# Patient Record
Sex: Female | Born: 1950 | Race: Black or African American | Hispanic: No | Smoking: Current every day smoker
Health system: Southern US, Community
[De-identification: ages and names within clinical notes are randomized; demographics above are authoritative.]

## PROBLEM LIST (undated history)

## (undated) DIAGNOSIS — I10 Essential (primary) hypertension: Secondary | ICD-10-CM

## (undated) DIAGNOSIS — E7800 Pure hypercholesterolemia, unspecified: Secondary | ICD-10-CM

## (undated) DIAGNOSIS — E119 Type 2 diabetes mellitus without complications: Secondary | ICD-10-CM

## (undated) HISTORY — PX: ABDOMINAL HYSTERECTOMY: SHX81

## (undated) HISTORY — PX: EXPLORATORY LAPAROTOMY: SUR591

## (undated) HISTORY — PX: OTHER SURGICAL HISTORY: SHX169

---

## 2003-09-22 ENCOUNTER — Inpatient Hospital Stay (HOSPITAL_COMMUNITY): Admission: AD | Admit: 2003-09-22 | Discharge: 2003-09-27 | Payer: Self-pay | Admitting: Internal Medicine

## 2003-09-22 ENCOUNTER — Encounter (INDEPENDENT_AMBULATORY_CARE_PROVIDER_SITE_OTHER): Payer: Self-pay | Admitting: Internal Medicine

## 2004-02-22 ENCOUNTER — Encounter (INDEPENDENT_AMBULATORY_CARE_PROVIDER_SITE_OTHER): Payer: Self-pay | Admitting: Internal Medicine

## 2004-02-22 LAB — CONVERTED CEMR LAB: Hgb A1c MFr Bld: 6.8 %

## 2004-07-05 ENCOUNTER — Ambulatory Visit (HOSPITAL_COMMUNITY): Admission: RE | Admit: 2004-07-05 | Discharge: 2004-07-05 | Payer: Self-pay | Admitting: General Surgery

## 2004-07-12 ENCOUNTER — Encounter (HOSPITAL_COMMUNITY): Admission: RE | Admit: 2004-07-12 | Discharge: 2004-07-13 | Payer: Self-pay | Admitting: Internal Medicine

## 2004-07-13 ENCOUNTER — Encounter (INDEPENDENT_AMBULATORY_CARE_PROVIDER_SITE_OTHER): Payer: Self-pay | Admitting: Internal Medicine

## 2005-01-28 ENCOUNTER — Ambulatory Visit: Payer: Self-pay | Admitting: Orthopedic Surgery

## 2005-05-21 ENCOUNTER — Encounter (INDEPENDENT_AMBULATORY_CARE_PROVIDER_SITE_OTHER): Payer: Self-pay | Admitting: Internal Medicine

## 2005-05-31 ENCOUNTER — Encounter (INDEPENDENT_AMBULATORY_CARE_PROVIDER_SITE_OTHER): Payer: Self-pay | Admitting: Internal Medicine

## 2005-10-08 ENCOUNTER — Ambulatory Visit: Payer: Self-pay | Admitting: Internal Medicine

## 2005-10-21 ENCOUNTER — Encounter (INDEPENDENT_AMBULATORY_CARE_PROVIDER_SITE_OTHER): Payer: Self-pay | Admitting: Internal Medicine

## 2005-10-21 LAB — CONVERTED CEMR LAB: Hgb A1c MFr Bld: 6.2 %

## 2005-10-28 ENCOUNTER — Encounter (INDEPENDENT_AMBULATORY_CARE_PROVIDER_SITE_OTHER): Payer: Self-pay | Admitting: Internal Medicine

## 2005-11-07 ENCOUNTER — Ambulatory Visit: Payer: Self-pay | Admitting: Internal Medicine

## 2005-11-12 ENCOUNTER — Encounter (INDEPENDENT_AMBULATORY_CARE_PROVIDER_SITE_OTHER): Payer: Self-pay | Admitting: Internal Medicine

## 2005-12-04 ENCOUNTER — Encounter (INDEPENDENT_AMBULATORY_CARE_PROVIDER_SITE_OTHER): Payer: Self-pay | Admitting: Internal Medicine

## 2006-01-02 ENCOUNTER — Ambulatory Visit: Payer: Self-pay | Admitting: Internal Medicine

## 2006-01-07 ENCOUNTER — Encounter: Payer: Self-pay | Admitting: Internal Medicine

## 2006-01-07 DIAGNOSIS — M1990 Unspecified osteoarthritis, unspecified site: Secondary | ICD-10-CM | POA: Insufficient documentation

## 2006-01-07 DIAGNOSIS — K219 Gastro-esophageal reflux disease without esophagitis: Secondary | ICD-10-CM | POA: Insufficient documentation

## 2006-01-07 DIAGNOSIS — J309 Allergic rhinitis, unspecified: Secondary | ICD-10-CM | POA: Insufficient documentation

## 2006-01-07 DIAGNOSIS — I10 Essential (primary) hypertension: Secondary | ICD-10-CM

## 2006-01-07 DIAGNOSIS — E782 Mixed hyperlipidemia: Secondary | ICD-10-CM

## 2006-02-13 ENCOUNTER — Ambulatory Visit: Payer: Self-pay | Admitting: Internal Medicine

## 2006-05-15 ENCOUNTER — Encounter: Payer: Self-pay | Admitting: Internal Medicine

## 2006-05-21 ENCOUNTER — Ambulatory Visit: Payer: Self-pay | Admitting: Internal Medicine

## 2006-05-21 DIAGNOSIS — F17200 Nicotine dependence, unspecified, uncomplicated: Secondary | ICD-10-CM

## 2006-05-21 LAB — CONVERTED CEMR LAB: Hgb A1c MFr Bld: 6 %

## 2006-05-22 ENCOUNTER — Ambulatory Visit (HOSPITAL_COMMUNITY): Admission: RE | Admit: 2006-05-22 | Discharge: 2006-05-22 | Payer: Self-pay | Admitting: Internal Medicine

## 2006-05-29 ENCOUNTER — Ambulatory Visit: Payer: Self-pay | Admitting: Internal Medicine

## 2006-06-19 ENCOUNTER — Encounter (INDEPENDENT_AMBULATORY_CARE_PROVIDER_SITE_OTHER): Payer: Self-pay | Admitting: Internal Medicine

## 2006-09-03 ENCOUNTER — Ambulatory Visit: Payer: Self-pay | Admitting: Internal Medicine

## 2006-09-03 LAB — CONVERTED CEMR LAB
Blood Glucose, Fingerstick: 93
Hgb A1c MFr Bld: 6.3 %

## 2006-09-04 ENCOUNTER — Encounter (INDEPENDENT_AMBULATORY_CARE_PROVIDER_SITE_OTHER): Payer: Self-pay | Admitting: Internal Medicine

## 2006-09-04 LAB — CONVERTED CEMR LAB
AST: 15 units/L (ref 0–37)
Alkaline Phosphatase: 65 units/L (ref 39–117)
BUN: 14 mg/dL (ref 6–23)
Chloride: 109 meq/L (ref 96–112)
Creatinine, Ser: 0.95 mg/dL (ref 0.40–1.20)
Glucose, Bld: 99 mg/dL (ref 70–99)
Potassium: 4.6 meq/L (ref 3.5–5.3)
Sodium: 142 meq/L (ref 135–145)
Total Protein: 7 g/dL (ref 6.0–8.3)

## 2006-10-08 ENCOUNTER — Encounter (INDEPENDENT_AMBULATORY_CARE_PROVIDER_SITE_OTHER): Payer: Self-pay | Admitting: Internal Medicine

## 2006-10-09 ENCOUNTER — Telehealth (INDEPENDENT_AMBULATORY_CARE_PROVIDER_SITE_OTHER): Payer: Self-pay | Admitting: *Deleted

## 2006-10-14 ENCOUNTER — Encounter (INDEPENDENT_AMBULATORY_CARE_PROVIDER_SITE_OTHER): Payer: Self-pay | Admitting: Internal Medicine

## 2006-10-14 ENCOUNTER — Ambulatory Visit (HOSPITAL_COMMUNITY): Admission: RE | Admit: 2006-10-14 | Discharge: 2006-10-14 | Payer: Self-pay | Admitting: Internal Medicine

## 2006-11-07 ENCOUNTER — Encounter (INDEPENDENT_AMBULATORY_CARE_PROVIDER_SITE_OTHER): Payer: Self-pay | Admitting: Internal Medicine

## 2006-11-11 ENCOUNTER — Telehealth (INDEPENDENT_AMBULATORY_CARE_PROVIDER_SITE_OTHER): Payer: Self-pay | Admitting: *Deleted

## 2006-11-14 ENCOUNTER — Ambulatory Visit: Payer: Self-pay | Admitting: Internal Medicine

## 2006-11-14 DIAGNOSIS — G5600 Carpal tunnel syndrome, unspecified upper limb: Secondary | ICD-10-CM | POA: Insufficient documentation

## 2006-11-14 DIAGNOSIS — E1149 Type 2 diabetes mellitus with other diabetic neurological complication: Secondary | ICD-10-CM

## 2007-01-01 ENCOUNTER — Ambulatory Visit: Payer: Self-pay | Admitting: Internal Medicine

## 2007-01-01 LAB — CONVERTED CEMR LAB: Hgb A1c MFr Bld: 6.6 %

## 2007-01-02 ENCOUNTER — Encounter (INDEPENDENT_AMBULATORY_CARE_PROVIDER_SITE_OTHER): Payer: Self-pay | Admitting: Internal Medicine

## 2007-01-02 LAB — CONVERTED CEMR LAB
Creatinine, Urine: 127.2 mg/dL
Microalb Creat Ratio: 13.3 mg/g (ref 0.0–30.0)
Microalb, Ur: 1.69 mg/dL (ref 0.00–1.89)

## 2007-04-03 ENCOUNTER — Ambulatory Visit: Payer: Self-pay | Admitting: Internal Medicine

## 2007-04-06 ENCOUNTER — Telehealth (INDEPENDENT_AMBULATORY_CARE_PROVIDER_SITE_OTHER): Payer: Self-pay | Admitting: *Deleted

## 2007-04-06 LAB — CONVERTED CEMR LAB
ALT: 14 units/L (ref 0–35)
AST: 17 units/L (ref 0–37)
Albumin: 3.8 g/dL (ref 3.5–5.2)
CO2: 18 meq/L — ABNORMAL LOW (ref 19–32)
Glucose, Bld: 109 mg/dL — ABNORMAL HIGH (ref 70–99)
HDL: 43 mg/dL (ref >39)
Potassium: 4.4 meq/L (ref 3.5–5.3)
Total CHOL/HDL Ratio: 3.4
VLDL: 26 mg/dL (ref 0–40)

## 2007-07-03 ENCOUNTER — Ambulatory Visit: Payer: Self-pay | Admitting: Internal Medicine

## 2007-07-03 LAB — CONVERTED CEMR LAB
Blood Glucose, Fingerstick: 97
Hgb A1c MFr Bld: 6.6 %

## 2007-10-23 ENCOUNTER — Ambulatory Visit: Payer: Self-pay | Admitting: Internal Medicine

## 2007-10-23 LAB — CONVERTED CEMR LAB: Blood Glucose, Fingerstick: 166

## 2007-10-26 LAB — CONVERTED CEMR LAB
AST: 17 units/L (ref 0–37)
Albumin: 3.9 g/dL (ref 3.5–5.2)
Alkaline Phosphatase: 67 units/L (ref 39–117)
Calcium: 8.9 mg/dL (ref 8.4–10.5)
Cholesterol: 141 mg/dL (ref 0–200)
Creatinine, Ser: 0.92 mg/dL (ref 0.40–1.20)
Potassium: 4.3 meq/L (ref 3.5–5.3)
Total Bilirubin: 0.3 mg/dL (ref 0.3–1.2)
Total CHOL/HDL Ratio: 3.7
Triglycerides: 256 mg/dL — ABNORMAL HIGH (ref <150)

## 2007-12-04 ENCOUNTER — Ambulatory Visit: Payer: Self-pay | Admitting: Internal Medicine

## 2008-01-01 ENCOUNTER — Ambulatory Visit: Payer: Self-pay | Admitting: Internal Medicine

## 2008-01-29 ENCOUNTER — Ambulatory Visit: Payer: Self-pay | Admitting: Internal Medicine

## 2008-01-29 LAB — CONVERTED CEMR LAB
Blood Glucose, Fingerstick: 97
Hgb A1c MFr Bld: 7 %

## 2008-05-06 ENCOUNTER — Ambulatory Visit: Payer: Self-pay | Admitting: Internal Medicine

## 2008-05-06 LAB — CONVERTED CEMR LAB: Blood Glucose, Fingerstick: 97

## 2008-05-13 ENCOUNTER — Encounter (INDEPENDENT_AMBULATORY_CARE_PROVIDER_SITE_OTHER): Payer: Self-pay | Admitting: Internal Medicine

## 2008-05-16 LAB — CONVERTED CEMR LAB
BUN: 14 mg/dL (ref 6–23)
Cholesterol: 140 mg/dL (ref 0–200)
Glucose, Bld: 109 mg/dL — ABNORMAL HIGH (ref 70–99)
HDL: 44 mg/dL (ref >39)
LDL Cholesterol: 67 mg/dL (ref 0–99)
Total Protein: 7.5 g/dL (ref 6.0–8.3)
Triglycerides: 145 mg/dL (ref <150)

## 2008-08-03 ENCOUNTER — Ambulatory Visit: Payer: Self-pay | Admitting: Internal Medicine

## 2008-08-03 DIAGNOSIS — M25519 Pain in unspecified shoulder: Secondary | ICD-10-CM

## 2008-08-03 LAB — CONVERTED CEMR LAB: Blood Glucose, Fingerstick: 141

## 2009-08-07 ENCOUNTER — Ambulatory Visit (HOSPITAL_COMMUNITY): Admission: RE | Admit: 2009-08-07 | Discharge: 2009-08-07 | Payer: Self-pay | Admitting: Family Medicine

## 2009-08-08 ENCOUNTER — Emergency Department (HOSPITAL_COMMUNITY): Admission: EM | Admit: 2009-08-08 | Discharge: 2009-08-09 | Payer: Self-pay | Admitting: Emergency Medicine

## 2009-08-31 ENCOUNTER — Encounter (HOSPITAL_BASED_OUTPATIENT_CLINIC_OR_DEPARTMENT_OTHER): Admission: RE | Admit: 2009-08-31 | Discharge: 2009-10-23 | Payer: Self-pay | Admitting: General Surgery

## 2009-09-01 ENCOUNTER — Ambulatory Visit (HOSPITAL_COMMUNITY): Admission: RE | Admit: 2009-09-01 | Discharge: 2009-09-01 | Payer: Self-pay | Admitting: General Surgery

## 2009-10-05 ENCOUNTER — Ambulatory Visit: Payer: Self-pay | Admitting: Surgery

## 2009-10-19 ENCOUNTER — Ambulatory Visit (HOSPITAL_COMMUNITY): Admission: RE | Admit: 2009-10-19 | Discharge: 2009-10-19 | Payer: Self-pay | Admitting: Neurology

## 2010-02-18 LAB — CONVERTED CEMR LAB
CO2: 20 meq/L
Calcium: 9.2 mg/dL
Chloride: 111 meq/L
Cholesterol: 140 mg/dL
HDL: 43 mg/dL
Hgb A1c MFr Bld: 5.7 %
Microalb Creat Ratio: 2.2 mg/g
Total Bilirubin: 0.3 mg/dL

## 2010-04-06 LAB — CBC
Hemoglobin: 13.3 g/dL (ref 12.0–15.0)
MCH: 31 pg (ref 26.0–34.0)
MCHC: 34.1 g/dL (ref 30.0–36.0)
MCV: 90.9 fL (ref 78.0–100.0)
RDW: 13.2 % (ref 11.5–15.5)

## 2010-04-06 LAB — HEMOGLOBIN A1C: Mean Plasma Glucose: 235 mg/dL — ABNORMAL HIGH (ref <117)

## 2010-04-06 LAB — COMPREHENSIVE METABOLIC PANEL
AST: 20 U/L (ref 0–37)
Albumin: 3.7 g/dL (ref 3.5–5.2)
Alkaline Phosphatase: 65 U/L (ref 39–117)
BUN: 12 mg/dL (ref 6–23)
Creatinine, Ser: 0.91 mg/dL (ref 0.4–1.2)
GFR calc Af Amer: >60 mL/min (ref >60)
Potassium: 4.6 mEq/L (ref 3.5–5.1)
Total Protein: 7.8 g/dL (ref 6.0–8.3)

## 2010-04-06 LAB — PREALBUMIN: Prealbumin: 22.1 mg/dL (ref 18.0–45.0)

## 2010-04-06 LAB — DIFFERENTIAL
Lymphocytes Relative: 31 % (ref 12–46)
Monocytes Absolute: 0.6 10*3/uL (ref 0.1–1.0)
Monocytes Relative: 10 % (ref 3–12)
Neutro Abs: 2.9 10*3/uL (ref 1.7–7.7)

## 2010-04-07 LAB — CBC
HCT: 36.9 % (ref 36.0–46.0)
Hemoglobin: 12.8 g/dL (ref 12.0–15.0)
RBC: 4.17 MIL/uL (ref 3.87–5.11)
RDW: 13.5 % (ref 11.5–15.5)
WBC: 6.2 10*3/uL (ref 4.0–10.5)

## 2010-04-07 LAB — DIFFERENTIAL
Basophils Absolute: 0.1 10*3/uL (ref 0.0–0.1)
Lymphocytes Relative: 34 % (ref 12–46)
Monocytes Absolute: 0.4 10*3/uL (ref 0.1–1.0)
Monocytes Relative: 7 % (ref 3–12)
Neutro Abs: 3.4 10*3/uL (ref 1.7–7.7)

## 2010-04-07 LAB — BASIC METABOLIC PANEL
GFR calc Af Amer: 49 mL/min — ABNORMAL LOW (ref >60)
GFR calc non Af Amer: 41 mL/min — ABNORMAL LOW (ref >60)
Potassium: 4.3 mEq/L (ref 3.5–5.1)
Sodium: 136 mEq/L (ref 135–145)

## 2010-04-07 LAB — CULTURE, ROUTINE-ABSCESS: Culture: NO GROWTH

## 2010-06-05 NOTE — Procedures (Signed)
DUPLEX DEEP VENOUS EXAM - LOWER EXTREMITY   INDICATION:  Ulcer.   HISTORY:  Edema:  Complaint of right leg swelling for 2 months.  Trauma/Surgery:  No.  Pain:  Right lateral calf pain for 2 months.  PE:  No.  Previous DVT:  No.  Anticoagulants:  No.  Other:  The patient states she has a healed right toe infection/wound  from a month ago that lasted for 2 months.   DUPLEX EXAM:                CFV   SFV   PopV  PTV    GSV                R  L  R  L  R  L  R   L  R  L  Thrombosis    o  o  o  o  o  o  o   o  o  o  Spontaneous   +  +  +  +  +  +  +   +  +  +  Phasic        +  +  +  +  +  +  +   +  +  +  Augmentation  +  +  +  +  +  +  +   +  +  +  Compressible  +  +  +  +  +  +  +   +  +  +  Competent     +  o  +  +  +  +  +   +  o  +   Legend:  + - yes  o - no  p - partial  D - decreased   IMPRESSION:  1. No evidence of deep vein thrombosis noted in the bilateral lower      extremities.  2. Mild reflux is noted in the right common femoral and left greater      saphenous veins.    _____________________________  V. Charlena Cross, MD   CH/MEDQ  D:  10/06/2009  T:  10/06/2009  Job:  [id number]

## 2010-06-08 NOTE — Discharge Summary (Signed)
NAME:  Sandra Mitchell, Sandra Mitchell                        ACCOUNT NO.:  [id number]   MEDICAL RECORD NO.:  [id number]                   PATIENT TYPE:  INP   LOCATION:  A305                                 FACILITY:  [facility]   PHYSICIAN:  Vania Rea, M.D.              DATE OF BIRTH:  1950-08-15   DATE OF ADMISSION:  09/22/2003  DATE OF DISCHARGE:  09/27/2003                                 DISCHARGE SUMMARY   PRIMARY CARE PHYSICIAN:  Dr. Jorene Guest in [place].   SURGICAL CONSULTATION:  Dirk Dress. Katrinka Blazing, M.D.   DISCHARGE DIAGNOSES:  1.  Cellulitis of the left lower extremity, resolved.  2.  Left lower extremity web space infection, improved.  3.  Diabetes mellitus type 2, controlled.  4.  Hyperlipidemia.  5.  Tobacco abuse.   DISPOSITION:  Discharge to home.   CONDITION ON DISCHARGE:  Stable.   DISCHARGE MEDICATIONS:  1.  Doxycycline 100 mg twice daily for one week.  2.  Levaquin 750 mg p.o. daily for three days.  3.  Glucophage X-R 500 mg twice daily.  4.  Lipitor 20 mg each evening.  5.  Nizoral cream apply in the first web space of left foot twice daily.   HOSPITAL COURSE:  This pleasant 60 year old African American lady was  admitted directed to [facility] after a phone call from Dr. Reynolds Bowl in [place].  Dr. Jorene Guest indicated that he had been  treating this lady for sometime with Rocephin and Keflex for cellulitis of  the left foot and it had been resolving very slowly.  The patient was  admitted directly by Dr. Delbert Harness. By her history the cellulitis of the leg  had improved considerably, but she continued to have swelling and cellulitis  of the left foot and she had necrotic areas in the web space of the left  foot which looked as if there was a fungal component.  The patient was  started on IV antibiotics and Nizoral cream to the web space of the foot.  A  surgical consult was done by Dr. Katrinka Blazing who felt that there may be some  element of peripheral  vascular disease also and an ABI  was ordered which  has been done today.   The patient's cellulitis has improved progressively on intravenous Levaquin  and doxycycline was also added by Dr. Katrinka Blazing.  Her blood pressure was fairly  well controlled during this hospital stay.  Her hemoglobin A1C was 7.  Metformin has been added to her medication in view of her hyperlipidemia and  her significant weight.  Today the cellulitis of her foot has completely  cleared; however, there remains healing of the web space of the left foot.  It remains quite necrotic.  Probably will need some debridement which can be  done as an outpatient.   Today the patient remains alert and pleasant, fully cooperative.  Her  temperature is  97.6, pulse 54, respirations 20, blood pressure 133/71.  Capillary blood sugar this morning was 104.  Her bedtime blood sugar was 156  which is the highest it has ever been.  Her pupils are round, equal and  reactive.  Her chest is clear to auscultation bilaterally.  Cardiovascular  system shows a regular rhythm without murmur.  Her abdomen is obese, soft  and nontender.  Her extremities are without edema.  Otherwise as described  above.   LABORATORY DATA:  Her white count is 5.6, hematocrit 39.7, platelets 209.  She has 45 neutrophils.  Sodium 139, potassium 4.6, chloride 109, CO2 23,  glucose 106, BUN 10, creatinine 1.  Liver function tests are completely  normal.  Albumin is 3.7, calcium 8.9.  Fasting lipid panel reveals a  cholesterol of 209, triglycerides 263, HDL 30, LDL 126 and a VLDL of 53.  She has been started on Zocor 20 mg at bedtime.  She will be discharged on  Lipitor 20 mg at bedtime.   FOLLOW UP:  Follow up with her primary care physician, Dr. Reynolds Bowl.  Follow up with the surgeon, Dr. Elpidio Anis.  Dr. Katrinka Blazing to follow up on the  ABI and ulcer in the first web space.   SPECIAL INSTRUCTIONS:  The patient has been advised to check her blood sugar  regularly,  to take her medications as prescribed.  She has been advised that  she needs to see an ophthalmologist at least once a year and a podiatrist  regularly.  She has been advised that she is to dry between the toes of her  feet thoroughly after taking a shower.     ___________________________________________                                         Vania Rea, M.D.   LC/MEDQ  D:  09/27/2003  T:  09/27/2003  Job:  [id number]

## 2010-06-08 NOTE — Procedures (Signed)
[name], Sandra Mitchell              ACCOUNT NO.:  [id number]   MEDICAL RECORD NO.:  [id number]          PATIENT TYPE:  OUT   LOCATION:  RAD                           FACILITY:  [facility]   PHYSICIAN:  Richard A. Alanda Amass, M.D.DATE OF BIRTH:  1950/12/20   DATE OF PROCEDURE:  07/05/2004  DATE OF DISCHARGE:                                  ECHOCARDIOGRAM   This 60 year old woman has a history of chest pain and possible cardiac  murmur.   Echocardiogram was technically adequate for interpretation.   The aorta is normal at 2.5 cm.   The aortic valve has three leaflets with normal opening and is functionally  and anatomically appears normal. There is no aortic stenosis.   Left atrium is normal at 3.7 cm. The patient was in sinus rhythm during the  study, and there were no clots seen.   Mitral valve opens normally. There is some minimal thickening of the  anterior mitral valve leaflet, but this is not specific. There is no  prolapse seen. There is trace mitral regurgitation present.   IVS and LVPW were concentrically thickened to 1.4 and 1.3 cm respectively.  This is compatible with mild to moderate concentric ventricular hypertrophy.   Left ventricle is normal in size with LVIDD measuring 4.1 cm and LVISD  measuring 2.6 cm. There were no segmental wall motion abnormalities with  normal thickening of all visualized segments. EF was greater than 55%.   LV inflow signal appears normal with no evidence of diastolic relaxation  abnormality on this study.   There is no pericardial effusion.   There is trace to mild tricuspid regurgitation present.   Right ventricle is normal.   Two-D echocardiogram shows concentric LVH of a mild to moderate degree.  There is normal systolic function. There is no significant valvular  abnormality.       RAW/MEDQ  D:  07/05/2004  T:  07/05/2004  Job:  [id number]

## 2011-05-10 ENCOUNTER — Ambulatory Visit (HOSPITAL_COMMUNITY)
Admission: RE | Admit: 2011-05-10 | Discharge: 2011-05-10 | Disposition: A | Discharge status: Home / Self Care | Payer: BC Managed Care – PPO | Source: Ambulatory Visit | Attending: Family Medicine | Admitting: Family Medicine

## 2011-05-10 ENCOUNTER — Other Ambulatory Visit (HOSPITAL_COMMUNITY): Payer: Self-pay | Admitting: Family Medicine

## 2011-05-10 DIAGNOSIS — I10 Essential (primary) hypertension: Secondary | ICD-10-CM

## 2011-05-10 DIAGNOSIS — E119 Type 2 diabetes mellitus without complications: Secondary | ICD-10-CM | POA: Insufficient documentation

## 2011-05-10 DIAGNOSIS — E785 Hyperlipidemia, unspecified: Secondary | ICD-10-CM | POA: Insufficient documentation

## 2011-05-10 DIAGNOSIS — R079 Chest pain, unspecified: Secondary | ICD-10-CM | POA: Insufficient documentation

## 2011-05-10 DIAGNOSIS — R059 Cough, unspecified: Secondary | ICD-10-CM | POA: Insufficient documentation

## 2011-05-10 DIAGNOSIS — J9819 Other pulmonary collapse: Secondary | ICD-10-CM | POA: Insufficient documentation

## 2011-05-10 DIAGNOSIS — R05 Cough: Secondary | ICD-10-CM

## 2011-05-10 DIAGNOSIS — F17200 Nicotine dependence, unspecified, uncomplicated: Secondary | ICD-10-CM | POA: Insufficient documentation

## 2013-08-02 ENCOUNTER — Other Ambulatory Visit (HOSPITAL_COMMUNITY): Payer: Self-pay | Admitting: Family Medicine

## 2013-08-02 DIAGNOSIS — Z139 Encounter for screening, unspecified: Secondary | ICD-10-CM

## 2013-08-06 ENCOUNTER — Ambulatory Visit (HOSPITAL_COMMUNITY)
Admission: RE | Admit: 2013-08-06 | Discharge: 2013-08-06 | Disposition: A | Discharge status: Home / Self Care | Payer: BC Managed Care – PPO | Source: Ambulatory Visit | Attending: Family Medicine | Admitting: Family Medicine

## 2013-08-06 DIAGNOSIS — Z1231 Encounter for screening mammogram for malignant neoplasm of breast: Secondary | ICD-10-CM | POA: Insufficient documentation

## 2013-08-06 DIAGNOSIS — Z139 Encounter for screening, unspecified: Secondary | ICD-10-CM

## 2013-11-15 NOTE — H&P (Signed)
  NTS SOAP Note  Vital Signs:  Vitals as of: 11/15/2013: Systolic 172: Diastolic 76: Heart Rate 62: Temp 98.74F: Height 746ft 0in: Weight 189Lbs 0 Ounces: BMI 25.63  BMI : 25.63 kg/m2  Subjective: This 63 year old female presents for of need for screening TCS.  Never has had a TCS.  No gi complaints.  No family h/o colon cancer.  Review of Symptoms:  Constitutional:unremarkable   Head:unremarkable Eyes:blurred vision bilateral sinus Cardiovascular:  unremarkable Respiratory:unremarkable Gastrointestinal:  unremarkable   Genitourinary:unremarkable   Musculoskeletal:unremarkable Skin:unremarkable Hematolgic/Lymphatic:unremarkable   Allergic/Immunologic:unremarkable   Past Medical History:  Reviewed  Past Medical History  Surgical History: exploratory lap in remote past Medical Problems: NIDDM,  HTN,  high cholesterol Allergies: nkda Medications: metformin,  pravastatin,  losartan,  baby asa,  amlodipine   Social History:Reviewed  Social History  Preferred Language: English Race:  White, Black or African American Ethnicity: Not Hispanic / Latino Age: [age] year Marital Status:  D Alcohol: no   Smoking Status: Current every day smoker reviewed on 11/02/2013 Started Date:  Packs per day: 0.50 Functional Status reviewed on 11/02/2013 ------------------------------------------------ Bathing: Normal Cooking: Normal Dressing: Normal Driving: Normal Eating: Normal Managing Meds: Normal Oral Care: Normal Shopping: Normal Toileting: Normal Transferring: Normal Walking: Normal Cognitive Status reviewed on 11/02/2013 ------------------------------------------------ Attention: Normal Decision Making: Normal Language: Normal Memory: Normal Motor: Normal Perception: Normal Problem Solving: Normal Visual and Spatial: Normal   Family History:Reviewed  Family Health History Mother, Living; Diabetes mellitus, unspecified type;  Father,  Deceased; Cancer unspecified;     Objective Information: General:Well appearing, well nourished in no distress. Heart:RRR, no murmur or gallop.  Normal S1, S2.  No S3, S4.  Lungs:  CTA bilaterally, no wheezes, rhonchi, rales.  Breathing unlabored. Abdomen:Soft, NT/ND, no HSM, no masses. deferred to procedure  Assessment:Need for screening TCS  Diagnoses: V76.51  Z12.11 Screening for malignant neoplasm of colon (Encounter for screening for malignant neoplasm of colon)  Procedures: 1610999202 - OFFICE OUTPATIENT NEW 20 MINUTES    Plan:  Scheduled for TCS on 12/14/13.   Patient Education:Alternative treatments to surgery were discussed with patient (and family).  Risks and benefits  of procedure including bleeding and perforation were fully explained to the patient (and family) who gave informed consent. Patient/family questions were addressed.  Follow-up:Pending Surgery

## 2013-12-14 ENCOUNTER — Ambulatory Visit (HOSPITAL_COMMUNITY)
Admission: RE | Admit: 2013-12-14 | Discharge: 2013-12-14 | Disposition: A | Discharge status: Home / Self Care | Payer: BC Managed Care – PPO | Source: Ambulatory Visit | Attending: General Surgery | Admitting: General Surgery

## 2013-12-14 ENCOUNTER — Encounter (HOSPITAL_COMMUNITY): Payer: Self-pay | Admitting: *Deleted

## 2013-12-14 ENCOUNTER — Encounter (HOSPITAL_COMMUNITY)
Admission: RE | Disposition: A | Discharge status: Home / Self Care | Payer: Self-pay | Source: Ambulatory Visit | Attending: General Surgery

## 2013-12-14 ENCOUNTER — Other Ambulatory Visit (HOSPITAL_COMMUNITY): Payer: Self-pay | Admitting: General Surgery

## 2013-12-14 DIAGNOSIS — I10 Essential (primary) hypertension: Secondary | ICD-10-CM | POA: Diagnosis not present

## 2013-12-14 DIAGNOSIS — E780 Pure hypercholesterolemia: Secondary | ICD-10-CM | POA: Diagnosis not present

## 2013-12-14 DIAGNOSIS — Z7982 Long term (current) use of aspirin: Secondary | ICD-10-CM | POA: Insufficient documentation

## 2013-12-14 DIAGNOSIS — Q438 Other specified congenital malformations of intestine: Secondary | ICD-10-CM | POA: Insufficient documentation

## 2013-12-14 DIAGNOSIS — Z79899 Other long term (current) drug therapy: Secondary | ICD-10-CM | POA: Insufficient documentation

## 2013-12-14 DIAGNOSIS — E119 Type 2 diabetes mellitus without complications: Secondary | ICD-10-CM | POA: Diagnosis not present

## 2013-12-14 DIAGNOSIS — R933 Abnormal findings on diagnostic imaging of other parts of digestive tract: Secondary | ICD-10-CM

## 2013-12-14 DIAGNOSIS — Z1211 Encounter for screening for malignant neoplasm of colon: Secondary | ICD-10-CM | POA: Insufficient documentation

## 2013-12-14 HISTORY — DX: Pure hypercholesterolemia, unspecified: E78.00

## 2013-12-14 HISTORY — DX: Type 2 diabetes mellitus without complications: E11.9

## 2013-12-14 HISTORY — DX: Essential (primary) hypertension: I10

## 2013-12-14 HISTORY — PX: COLONOSCOPY: SHX5424

## 2013-12-14 LAB — GLUCOSE, CAPILLARY: GLUCOSE-CAPILLARY: 116 mg/dL — AB (ref 70–99)

## 2013-12-14 SURGERY — COLONOSCOPY
Anesthesia: Moderate Sedation

## 2013-12-14 MED ORDER — MIDAZOLAM HCL 5 MG/5ML IJ SOLN
INTRAMUSCULAR | Status: DC | PRN
  Administered 2013-12-14: 3 mg via INTRAVENOUS
  Administered 2013-12-14: 1 mg via INTRAVENOUS

## 2013-12-14 MED ORDER — SODIUM CHLORIDE 0.9 % IV SOLN
INTRAVENOUS | Status: DC
  Administered 2013-12-14: 08:00:00 via INTRAVENOUS

## 2013-12-14 MED ORDER — MEPERIDINE HCL 50 MG/ML IJ SOLN
INTRAMUSCULAR | Status: DC | PRN
  Administered 2013-12-14: 50 mg

## 2013-12-14 MED ORDER — MEPERIDINE HCL 50 MG/ML IJ SOLN
INTRAMUSCULAR | Status: AC
  Filled 2013-12-14: qty 1

## 2013-12-14 MED ORDER — STERILE WATER FOR IRRIGATION IR SOLN
Status: DC | PRN
  Administered 2013-12-14: 08:00:00

## 2013-12-14 MED ORDER — MIDAZOLAM HCL 5 MG/5ML IJ SOLN
INTRAMUSCULAR | Status: AC
  Filled 2013-12-14: qty 10

## 2013-12-14 NOTE — Interval H&P Note (Signed)
History and Physical Interval Note:  12/14/2013 8:23 AM  Sandra Mitchell  has presented today for surgery, with the diagnosis of screening  The various methods of treatment have been discussed with the patient and family. After consideration of risks, benefits and other options for treatment, the patient has consented to  Procedure(s): COLONOSCOPY (N/A) as a surgical intervention .  The patient's history has been reviewed, patient examined, no change in status, stable for surgery.  I have reviewed the patient's chart and labs.  Questions were answered to the patient's satisfaction.     Franky MachoJENKINS,Garhett Bernhard A

## 2013-12-14 NOTE — Discharge Instructions (Signed)

## 2013-12-14 NOTE — Op Note (Signed)
[facility] [address] [place], [id number]   COLONOSCOPY PROCEDURE REPORT     EXAM DATE: 12/14/2013  PATIENT NAME:      Sandra Mitchell, Sandra W           MR #:      [id number]  BIRTHDATE:       15-Oct-1950      VISIT #:     [id number]  ATTENDING:     Franky MachoMark Rylynne Schicker, MD     STATUS:     outpatient REFERRING MD:      Assunta FoundJohn Golding, M.D. ASA CLASS:        Class II  INDICATIONS:  The patient is a 63 yr old female here for a colonoscopy due to average risk for colon cancer. PROCEDURE PERFORMED:     Colonoscopy, attempted:  Flexible sigmoidoscopy MEDICATIONS:     Demerol 50 mg IV and Versed 5 mg IV ESTIMATED BLOOD LOSS:     None  CONSENT: The patient understands the risks and benefits of the procedure and understands that these risks include, but are not limited to: sedation, allergic reaction, infection, perforation and/or bleeding. Alternative means of evaluation and treatment include, among others: physical exam, x-rays, and/or surgical intervention. The patient elects to proceed with this endoscopic procedure.  DESCRIPTION OF PROCEDURE: During intra-op preparation period all mechanical & medical equipment was checked for proper function. Hand hygiene and appropriate measures for infection prevention was taken. After the risks, benefits and alternatives of the procedure were thoroughly explained, Informed consent was verified, confirmed and timeout was successfully executed by the treatment team. A digital exam revealed no abnormalities of the rectum.      The EC-[id number] ([id number] endoscope was introduced through the anus and advanced to the sigmoid colon and Unable to pass the scope through the tortuous sigmoid colon.  No masses noted.. No adverse events experienced. The prep was adequate, using Trilyte. The instrument was then slowly withdrawn as the colon was fully examined.   COLON FINDINGS: Unable to complete colonoscopy due to tortuous sigmoid colon.  Retroflexed views revealed no abnormalities.  The scope was then completely withdrawn from the patient and the procedure terminated. WITHDRAWAL TIME: 15 minutes 0 seconds    ADVERSE EVENTS:      There were no immediate complications.  IMPRESSIONS:     Unable to complete colonoscopy due to tortuous sigmoid colon  RECOMMENDATIONS:     My office will arrange to you to have a barium enema performed.  This is a radiology test to further examine your colon. RECALL:  Franky MachoMark Alixandra Alfieri, MD eSigned:  Franky MachoMark Abundio Teuscher, MD 12/14/2013 8:58 AM   cc:  CPT CODES: ICD CODES:  The ICD and CPT codes recommended by this software are interpretations from the data that the clinical staff has captured with the software.  The verification of the translation of this report to the ICD and CPT codes and modifiers is the sole responsibility of the health care institution and practicing physician where this report was generated.  [employer] will not be held responsible for the validity of the ICD and CPT codes included on this report.  AMA assumes no liability for data contained or not contained herein. CPT is a Publishing rights managerregistered trademark of the [employer].

## 2013-12-15 ENCOUNTER — Ambulatory Visit (HOSPITAL_COMMUNITY)
Admission: RE | Admit: 2013-12-15 | Discharge: 2013-12-15 | Disposition: A | Discharge status: Home / Self Care | Payer: BC Managed Care – PPO | Source: Ambulatory Visit | Attending: General Surgery | Admitting: General Surgery

## 2013-12-15 ENCOUNTER — Encounter (HOSPITAL_COMMUNITY): Payer: Self-pay | Admitting: General Surgery

## 2013-12-15 DIAGNOSIS — K5730 Diverticulosis of large intestine without perforation or abscess without bleeding: Secondary | ICD-10-CM | POA: Diagnosis not present

## 2013-12-15 DIAGNOSIS — R933 Abnormal findings on diagnostic imaging of other parts of digestive tract: Secondary | ICD-10-CM

## 2013-12-15 DIAGNOSIS — Q438 Other specified congenital malformations of intestine: Secondary | ICD-10-CM | POA: Insufficient documentation

## 2014-08-03 ENCOUNTER — Ambulatory Visit (HOSPITAL_COMMUNITY): Payer: BLUE CROSS/BLUE SHIELD | Attending: Podiatry | Admitting: Physical Therapy

## 2014-08-03 DIAGNOSIS — M25671 Stiffness of right ankle, not elsewhere classified: Secondary | ICD-10-CM | POA: Insufficient documentation

## 2014-08-03 DIAGNOSIS — M2141 Flat foot [pes planus] (acquired), right foot: Secondary | ICD-10-CM | POA: Insufficient documentation

## 2014-08-03 DIAGNOSIS — Z9889 Other specified postprocedural states: Secondary | ICD-10-CM | POA: Diagnosis not present

## 2014-08-03 DIAGNOSIS — R29898 Other symptoms and signs involving the musculoskeletal system: Secondary | ICD-10-CM

## 2014-08-03 DIAGNOSIS — R262 Difficulty in walking, not elsewhere classified: Secondary | ICD-10-CM | POA: Diagnosis present

## 2014-08-03 DIAGNOSIS — R269 Unspecified abnormalities of gait and mobility: Secondary | ICD-10-CM | POA: Diagnosis present

## 2014-08-03 DIAGNOSIS — M25674 Stiffness of right foot, not elsewhere classified: Secondary | ICD-10-CM

## 2014-08-03 NOTE — Therapy (Signed)
[facility] [address] [place], [place], [address] Phone: [phone]   Fax:  [phone]  Physical Therapy Evaluation  Patient Details  Name: Sandra Mitchell MRN: [id number] Date of Birth: 1950-02-05 Referring Provider:  Merwyn Katos, DPM  Encounter Date: 08/03/2014      PT End of Session - 08/03/14 1323    Visit Number 1   Number of Visits 12   Date for PT Re-Evaluation 09/03/14   Authorization Type BC[employer]   Authorization - Visit Number 1   Authorization - Number of Visits 12   PT Start Time 0932   PT Stop Time 1014   PT Time Calculation (min) 42 min   Activity Tolerance Patient tolerated treatment well   Behavior During Therapy [facility] for tasks assessed/performed      Past Medical History  Diagnosis Date  . Hypertension   . Hypercholesteremia   . Diabetes mellitus without complication     Past Surgical History  Procedure Laterality Date  . Abdominal hysterectomy    . Left knee arthroscopy    . Exploratory laparotomy    . Colonoscopy N/A 12/14/2013    Procedure: COLONOSCOPY;  Surgeon: Dalia Heading, MD;  Location: [facility];  Service: Gastroenterology;  Laterality: N/A;    There were no vitals filed for this visit.  Visit Diagnosis:  Status post bunionectomy  Difficulty walking  Stiffness of ankle joint, right  Joint stiffness of toe of right foot  Weakness of right foot  Abnormality of gait      Subjective Assessment - 08/03/14 0940    Subjective Pt reports that she had a bunionectomy done on 06/14/14. She is still experiencing some pain along the top of her R foot. She is having difficulty walking, and she feels that she is walking on the outside of her foot. She experiences swelling in her foot and numbness in her big toe.    How long can you sit comfortably? no limitations   How long can you stand comfortably? 20-30 minutes   How long can you walk comfortably? 10 minutes   Patient Stated Goals  Decrease soreness, walk normally, get feeling back in her toes.    Currently in Pain? Yes   Pain Score 3    Pain Location Foot   Pain Orientation Right   Pain Descriptors / Indicators Numbness;Sharp;Sore            [facility] PT Assessment - 08/03/14 0001    Assessment   Medical Diagnosis s/p bunionectomy   Onset Date/Surgical Date 06/14/14   Next MD Visit 08/10/14   Prior Therapy No   Precautions   Precautions None   Restrictions   Weight Bearing Restrictions No   Balance Screen   Has the patient fallen in the past 6 months No   Home Environment   Living Environment Private residence   Living Arrangements Parent   Type of Home House   Home Layout One level   Prior Function   Level of Independence Independent   Vocation Unemployed   Vocation Requirements prior job required standing for long periods of time   Leisure yardwork   Observation/Other Assessments   Focus on Therapeutic Outcomes (FOTO)  47   Posture/Postural Control   Posture/Postural Control Postural limitations   Posture Comments Pt stands in supination on RLE, keeps great toe in extension   ROM / Strength   AROM / PROM / Strength AROM;PROM;Strength   AROM   AROM Assessment Site  Ankle;Other (comment)  Foot   Right/Left Ankle Right;Left   Right Ankle Dorsiflexion 0   Right Ankle Plantar Flexion 20   Right Ankle Inversion 26   Right Ankle Eversion 15   Left Ankle Dorsiflexion 0   Left Ankle Plantar Flexion 45   Left Ankle Inversion 33   Left Ankle Eversion 15   PROM   Overall PROM Comments Left MTP extension: 70, flexion: 40   R MTP flexion: 10, extension: 40   PROM Assessment Site Ankle  Foot   Right/Left Ankle Right;Left   Left Ankle Dorsiflexion 5  Right: 0   Left Ankle Plantar Flexion 52  Right: 24   Left Ankle Inversion 40  Right: 30   Left Ankle Eversion 20  Right:  20   Strength   Strength Assessment Site Ankle   Right/Left Ankle Right;Left   Right Ankle Dorsiflexion 4-/5   Right Ankle  Plantar Flexion 4-/5   Right Ankle Inversion 4-/5   Right Ankle Eversion 4/5   Left Ankle Dorsiflexion 4+/5   Left Ankle Plantar Flexion 4+/5   Left Ankle Inversion 4+/5   Left Ankle Eversion 4+/5   Palpation   Palpation comment hypomobility of R talocural joint, hypermobility of first ray   Ambulation/Gait   Ambulation/Gait Yes   Ambulation/Gait Assistance 5: Supervision   Ambulation Distance (Feet) 20 Feet   Gait Pattern Decreased step length - left;Decreased weight shift to right  no toe off on R, ambulates with 1st MTP extension            PT Education - 08/03/14 1322    Education provided Yes   Education Details HEP prescribed for ankle and MTP ROM   Person(s) Educated Patient   Methods Explanation;Handout   Comprehension Verbalized understanding;Returned demonstration          PT Short Term Goals - 08/03/14 1342    PT SHORT TERM GOAL #1   Title Pt will be independent with HEP   Time 3   Period Weeks   Status New   PT SHORT TERM GOAL #2   Title R ankle plantarflexion and inversion will increase by 10 and 4 degrees, respectively, to improve gait mechanics.    Time 3   Period Weeks   Status New   PT SHORT TERM GOAL #3   Title R MPT extension will improve to 55 degrees to improve toe off during ambulation.    Time 3   Period Weeks   PT SHORT TERM GOAL #4   Title Pt will ambulate 100 feet with decreased supination and improve toe-off to demonstrate normalized gait mechanics and to decrease pain.    Time 3   Period Weeks   Status New           PT Long Term Goals - 08/03/14 1537    PT LONG TERM GOAL #1   Title Pt will be independent with advanced HEP for foot and ankle strength, proprioception, gait, and balance.   Time 6   Period Weeks   Status New   PT LONG TERM GOAL #2   Title R ankle plantarflexion and inversion will improve by 25 and 7 degrees respectively to improve gait mechanics.    Time 6   Period Weeks   Status New   PT LONG TERM GOAL #3    Title R MTP extension will improve to 65 degrees to improve toe-off and normalize gait mechanics.    Time 6   Period Weeks   Status  New   PT LONG TERM GOAL #4   Title Pt will ambulate 1,000 feet with without pain and with proper gait mechanics, demonstrating no excess supination and proper toe off during gait.   Time 6   Period Weeks   Status New               Plan - 08/03/14 1324    Clinical Impression Statement Pt presents s/p R bunionectomy with impairments in ankle/foot ROM, strength, posture, and gait, which are limiting her functional mobility and activity tolerance. Pt stands and ambulates with supination of R foot to avoid weight bearing on the first ray, and her MPT ROM is significantly limited. She will benefit from skilled physical therapy to address these impairments to restore normal mobility of the foot and ankle, which in turn will improve her gait mechanics and functional activitiy tolerance.  Pt was advised to attend 3x per week x 4 weeks, however, she reported that that would not be possible due to her copay. She is agreeable to coming 2x per week.   Pt will benefit from skilled therapeutic intervention in order to improve on the following deficits Abnormal gait;Decreased activity tolerance;Decreased balance;Decreased endurance;Decreased mobility;Decreased range of motion;Decreased strength;Difficulty walking;Hypermobility;Hypomobility;Pain   Rehab Potential Good   PT Frequency 2x / week   PT Duration 6 weeks   PT Treatment/Interventions Gait training;Stair training;Functional mobility training;Therapeutic activities;Therapeutic exercise;Balance training;Neuromuscular re-education;Manual techniques;Scar mobilization;Passive range of motion   PT Next Visit Plan Begin manual therapy to increase mobility of R talocrual joint, PROM to improve MTP flexion/extension,          Problem List Patient Active Problem List   Diagnosis Date Noted  . SHOULDER PAIN, RIGHT  08/03/2008  . DIABETES MELLITUS, WITH NEUROLOGICAL COMPLICATIONS 11/14/2006  . CARPAL TUNNEL SYNDROME, BILATERAL 11/14/2006  . TOBACCO ABUSE 05/21/2006  . HYPERLIPIDEMIA 01/07/2006  . HYPERTENSION 01/07/2006  . ALLERGIC RHINITIS 01/07/2006  . GERD 01/07/2006  . OSTEOARTHRITIS 01/07/2006    Leona Singleton, PT, DPT [phone] 08/03/2014, 3:47 PM  [facility] [address] [place], [place], [address] Phone: [phone]   Fax:  [phone]

## 2014-08-03 NOTE — Patient Instructions (Addendum)
ROM: Inversion / Eversion   With left leg relaxed, gently turn ankle and foot in and out. Move through full range of motion. Avoid pain. Repeat _15_ times per set. Do __5__ sets per session. Do __1__ sessions per day.    ROM: Plantar / Dorsiflexion   With left leg relaxed, gently flex and extend ankle. Move through full range of motion. Avoid pain. Repeat _15_ times per set. Do _2___ sets per session. Do _1___ sessions per day.     Toe Spread   With foot bare, spread and straighten toes, then squeeze and curl toes. Hold _5___ seconds each position. Repeat __15__ times. Do __1__ sessions per day.   Ankle Alphabet   Using left ankle and foot only, trace the letters of the alphabet. Perform A to Z. Repeat __3__ times per set. Do _1___ sets per session. Do __1__ sessions per day.

## 2014-08-10 ENCOUNTER — Encounter (HOSPITAL_COMMUNITY): Payer: BLUE CROSS/BLUE SHIELD

## 2014-08-23 ENCOUNTER — Encounter (HOSPITAL_COMMUNITY): Payer: BLUE CROSS/BLUE SHIELD

## 2014-08-30 ENCOUNTER — Encounter (HOSPITAL_COMMUNITY): Payer: BLUE CROSS/BLUE SHIELD

## 2014-09-06 ENCOUNTER — Encounter (HOSPITAL_COMMUNITY): Payer: BLUE CROSS/BLUE SHIELD

## 2014-09-13 ENCOUNTER — Encounter (HOSPITAL_COMMUNITY): Payer: BLUE CROSS/BLUE SHIELD

## 2014-09-20 ENCOUNTER — Encounter (HOSPITAL_COMMUNITY): Payer: BLUE CROSS/BLUE SHIELD | Admitting: Physical Therapy

## 2014-09-27 ENCOUNTER — Encounter (HOSPITAL_COMMUNITY): Payer: BLUE CROSS/BLUE SHIELD | Admitting: Physical Therapy

## 2014-12-12 ENCOUNTER — Encounter (HOSPITAL_COMMUNITY): Payer: Self-pay | Admitting: Physical Therapy

## 2014-12-12 NOTE — Therapy (Signed)
[place], [place], [address] Phone: [phone]   Fax:  [phone]  Patient Details  Name: Sandra Mitchell MRN: [id number] Date of Birth: 10-19-1950 Referring Provider:  No ref. provider found  Encounter Date: 12/12/2014  PHYSICAL THERAPY DISCHARGE SUMMARY  Visits from Start of Care: 1  Current functional level related to goals / functional outcomes: Pt did not return to PT following initial evaluation. Unable to assess current functional level.      Remaining deficits: Unable to assess   Education / Equipment: N/A  Plan:                                                    Patient goals were not met. Patient is being discharged due to not returning since the last visit.  ?????       [name], PT, DPT [phone] 12/12/2014, 2:30 PM  [place], [place], [address] Phone: [phone]   Fax:  [phone]

## 2016-03-21 DIAGNOSIS — J00 Acute nasopharyngitis [common cold]: Secondary | ICD-10-CM | POA: Diagnosis not present

## 2016-04-09 DIAGNOSIS — Z23 Encounter for immunization: Secondary | ICD-10-CM | POA: Diagnosis not present

## 2016-04-09 DIAGNOSIS — E1165 Type 2 diabetes mellitus with hyperglycemia: Secondary | ICD-10-CM | POA: Diagnosis not present

## 2016-04-09 DIAGNOSIS — E663 Overweight: Secondary | ICD-10-CM | POA: Diagnosis not present

## 2016-04-09 DIAGNOSIS — I10 Essential (primary) hypertension: Secondary | ICD-10-CM | POA: Diagnosis not present

## 2016-04-09 DIAGNOSIS — J029 Acute pharyngitis, unspecified: Secondary | ICD-10-CM | POA: Diagnosis not present

## 2016-04-09 DIAGNOSIS — Z6827 Body mass index (BMI) 27.0-27.9, adult: Secondary | ICD-10-CM | POA: Diagnosis not present

## 2016-04-09 DIAGNOSIS — J309 Allergic rhinitis, unspecified: Secondary | ICD-10-CM | POA: Diagnosis not present

## 2016-04-09 DIAGNOSIS — E782 Mixed hyperlipidemia: Secondary | ICD-10-CM | POA: Diagnosis not present

## 2016-04-09 DIAGNOSIS — L0292 Furuncle, unspecified: Secondary | ICD-10-CM | POA: Diagnosis not present

## 2016-04-09 DIAGNOSIS — Z1389 Encounter for screening for other disorder: Secondary | ICD-10-CM | POA: Diagnosis not present

## 2016-04-09 DIAGNOSIS — E1140 Type 2 diabetes mellitus with diabetic neuropathy, unspecified: Secondary | ICD-10-CM | POA: Diagnosis not present

## 2016-04-09 DIAGNOSIS — A4902 Methicillin resistant Staphylococcus aureus infection, unspecified site: Secondary | ICD-10-CM | POA: Diagnosis not present

## 2016-07-26 ENCOUNTER — Other Ambulatory Visit (HOSPITAL_COMMUNITY): Payer: Self-pay | Admitting: Family Medicine

## 2016-07-26 DIAGNOSIS — Z1231 Encounter for screening mammogram for malignant neoplasm of breast: Secondary | ICD-10-CM

## 2016-08-14 ENCOUNTER — Ambulatory Visit (HOSPITAL_COMMUNITY)
Admission: RE | Admit: 2016-08-14 | Discharge: 2016-08-14 | Disposition: A | Discharge status: Home / Self Care | Payer: Medicare Other | Source: Ambulatory Visit | Attending: Family Medicine | Admitting: Family Medicine

## 2016-08-14 DIAGNOSIS — Z1231 Encounter for screening mammogram for malignant neoplasm of breast: Secondary | ICD-10-CM | POA: Diagnosis not present

## 2016-10-15 DIAGNOSIS — Z23 Encounter for immunization: Secondary | ICD-10-CM | POA: Diagnosis not present

## 2016-10-15 DIAGNOSIS — Z6827 Body mass index (BMI) 27.0-27.9, adult: Secondary | ICD-10-CM | POA: Diagnosis not present

## 2016-10-15 DIAGNOSIS — I10 Essential (primary) hypertension: Secondary | ICD-10-CM | POA: Diagnosis not present

## 2016-10-15 DIAGNOSIS — E663 Overweight: Secondary | ICD-10-CM | POA: Diagnosis not present

## 2016-10-15 DIAGNOSIS — Z1389 Encounter for screening for other disorder: Secondary | ICD-10-CM | POA: Diagnosis not present

## 2016-10-15 DIAGNOSIS — E782 Mixed hyperlipidemia: Secondary | ICD-10-CM | POA: Diagnosis not present

## 2016-10-15 DIAGNOSIS — E785 Hyperlipidemia, unspecified: Secondary | ICD-10-CM | POA: Diagnosis not present

## 2016-10-15 DIAGNOSIS — E1140 Type 2 diabetes mellitus with diabetic neuropathy, unspecified: Secondary | ICD-10-CM | POA: Diagnosis not present

## 2016-10-17 LAB — HEMOGLOBIN A1C: Hemoglobin A1C: 9

## 2016-11-15 ENCOUNTER — Encounter: Payer: Self-pay | Admitting: "Endocrinology

## 2016-11-15 ENCOUNTER — Ambulatory Visit (INDEPENDENT_AMBULATORY_CARE_PROVIDER_SITE_OTHER): Payer: Medicare Other | Admitting: "Endocrinology

## 2016-11-15 VITALS — BP 154/77 | HR 65 | Ht 69.5 in | Wt 187.0 lb

## 2016-11-15 DIAGNOSIS — E782 Mixed hyperlipidemia: Secondary | ICD-10-CM

## 2016-11-15 DIAGNOSIS — I10 Essential (primary) hypertension: Secondary | ICD-10-CM

## 2016-11-15 DIAGNOSIS — E1165 Type 2 diabetes mellitus with hyperglycemia: Secondary | ICD-10-CM | POA: Diagnosis not present

## 2016-11-15 NOTE — Progress Notes (Signed)
Consult Note       11/15/2016, 12:05 PM   Subjective:    Patient ID: Sandra Mitchell, female    DOB: 25-Feb-1950.  she is being seen in consultation for management of currently uncontrolled symptomatic diabetes requested by  Sharilyn Sites, MD.   Past Medical History:  Diagnosis Date  . Diabetes mellitus without complication ([place])   . Hypercholesteremia   . Hypertension    Past Surgical History:  Procedure Laterality Date  . ABDOMINAL HYSTERECTOMY    . COLONOSCOPY N/A 12/14/2013   Procedure: COLONOSCOPY;  Surgeon: Jamesetta So, MD;  Location: [facility];  Service: Gastroenterology;  Laterality: N/A;  . EXPLORATORY LAPAROTOMY    . Left knee arthroscopy     Social History   Social History  . Marital status: Divorced    Spouse name: N/A  . Number of children: N/A  . Years of education: N/A   Social History Main Topics  . Smoking status: Current Every Day Smoker    Packs/day: 0.50    Years: 20.00    Types: Cigarettes  . Smokeless tobacco: Never Used  . Alcohol use No  . Drug use: No  . Sexual activity: Not Asked   Other Topics Concern  . None   Social History Narrative  . None   Outpatient Encounter Prescriptions as of 11/15/2016  Medication Sig  . amLODipine (NORVASC) 5 MG tablet Take 5 mg by mouth daily.  Marland Kitchen aspirin EC 81 MG tablet Take 81 mg by mouth daily.  . Cholecalciferol (VITAMIN D-3) 1000 UNITS CAPS Take 1 capsule by mouth daily.  Marland Kitchen losartan (COZAAR) 100 MG tablet Take 100 mg by mouth daily.  . metFORMIN (GLUCOPHAGE) 1000 MG tablet Take 1,000 mg by mouth 2 (two) times daily with a meal.  . Multiple Vitamins-Minerals (BIOTECT PLUS PO) Take by mouth.  . Omega 3 1000 MG CAPS Take by mouth.  . pravastatin (PRAVACHOL) 20 MG tablet Take 20 mg by mouth daily.   No facility-administered encounter medications on file as of 11/15/2016.     ALLERGIES: Allergies  Allergen  Reactions  . Ace Inhibitors     REACTION: cough  . Ampicillin     VACCINATION STATUS: Immunization History  Administered Date(s) Administered  . H1N1 12/04/2007  . Influenza Whole 11/07/2005, 11/14/2006, 10/23/2007  . Pneumococcal Polysaccharide-23 01/01/2007    Diabetes  She presents for her initial diabetic visit. She has type 2 diabetes mellitus. Onset time: she was diagned at approximate age of 35 years. Her disease course has been worsening. There are no hypoglycemic associated symptoms. Pertinent negatives for hypoglycemia include no confusion, headaches, pallor or seizures. Associated symptoms include blurred vision, fatigue, foot paresthesias, polydipsia and polyuria. Pertinent negatives for diabetes include no chest pain and no polyphagia. There are no hypoglycemic complications. Symptoms are worsening. There are no diabetic complications. Risk factors for coronary artery disease include diabetes mellitus, dyslipidemia, hypertension, obesity, post-menopausal, tobacco exposure and sedentary lifestyle. Current diabetic treatment includes oral agent (monotherapy). Her weight is stable. She is following a generally unhealthy diet. When asked about meal planning, she reported none. She has not had a  previous visit with a dietitian. She never participates in exercise. (She did not bring any maternal logs to review today. She admits that she does not monitor blood glucose regularly. ) An ACE inhibitor/angiotensin II receptor blocker is being taken. She does not see a podiatrist.Eye exam is not current.  Hyperlipidemia  This is a chronic problem. The current episode started more than 1 year ago. The problem is controlled. Exacerbating diseases include diabetes. Pertinent negatives include no chest pain, myalgias or shortness of breath. Current antihyperlipidemic treatment includes statins. Risk factors for coronary artery disease include diabetes mellitus, dyslipidemia, hypertension, a sedentary  lifestyle, post-menopausal and family history.  Hypertension  This is a chronic problem. The current episode started more than 1 year ago. The problem is uncontrolled. Associated symptoms include blurred vision. Pertinent negatives include no chest pain, headaches, palpitations or shortness of breath. Risk factors for coronary artery disease include smoking/tobacco exposure, sedentary lifestyle, dyslipidemia, diabetes mellitus and family history. Past treatments include alpha 1 blockers.     Review of Systems  Constitutional: Positive for fatigue. Negative for chills, fever and unexpected weight change.  HENT: Negative for trouble swallowing and voice change.   Eyes: Positive for blurred vision. Negative for visual disturbance.  Respiratory: Negative for cough, shortness of breath and wheezing.   Cardiovascular: Negative for chest pain, palpitations and leg swelling.  Gastrointestinal: Negative for diarrhea, nausea and vomiting.  Endocrine: Positive for polydipsia and polyuria. Negative for cold intolerance, heat intolerance and polyphagia.  Musculoskeletal: Negative for arthralgias and myalgias.  Skin: Negative for color change, pallor, rash and wound.  Neurological: Negative for seizures and headaches.  Psychiatric/Behavioral: Negative for confusion and suicidal ideas.    Objective:    BP (!) 154/77   Pulse 65   Ht 5' 9.5" (1.765 m)   Wt 187 lb (84.8 kg)   BMI 27.22 kg/m   Wt Readings from Last 3 Encounters:  11/15/16 187 lb (84.8 kg)  12/14/13 187 lb (84.8 kg)  08/03/08 190 lb 12 oz (86.5 kg)     Physical Exam  Constitutional: She is oriented to person, place, and time. She appears well-developed.  HENT:  Head: Normocephalic and atraumatic.  Eyes: EOM are normal.  Neck: Normal range of motion. Neck supple. No tracheal deviation present. No thyromegaly present.  Cardiovascular: Normal rate and regular rhythm.   Pulmonary/Chest: Effort normal and breath sounds normal.   Abdominal: Soft. Bowel sounds are normal. There is no tenderness. There is no guarding.  Musculoskeletal: Normal range of motion. She exhibits no edema.  Neurological: She is alert and oriented to person, place, and time. She has normal reflexes. No cranial nerve deficit. Coordination normal.  Skin: Skin is warm and dry. No rash noted. No erythema. No pallor.  Psychiatric:  Dysphoric mood, reluctant and unconcerned affect.     CMP ( most recent) CMP     Component Value Date/Time   NA 139 09/01/2009 1315   K 4.6 09/01/2009 1315   CL 108 09/01/2009 1315   CO2 26 09/01/2009 1315   GLUCOSE 115 (H) 09/01/2009 1315   BUN 12 09/01/2009 1315   CREATININE 0.91 09/01/2009 1315   CALCIUM 9.2 09/01/2009 1315   PROT 7.8 09/01/2009 1315   ALBUMIN 3.7 09/01/2009 1315   AST 20 09/01/2009 1315   ALT 23 09/01/2009 1315   ALKPHOS 65 09/01/2009 1315   BILITOT 0.5 09/01/2009 1315   GFRNONAA >60 09/01/2009 1315   GFRAA  09/01/2009 1315    >60  The eGFR has been calculated using the MDRD equation. This calculation has not been validated in all clinical situations. eGFR's persistently <60 mL/min signify possible Chronic Kidney Disease.     Diabetic Labs (most recent): Lab Results  Component Value Date   HGBA1C 9 10/17/2016   HGBA1C (H) 09/01/2009    9.8 (NOTE)                                                                       According to the ADA Clinical Practice Recommendations for 2011, when HbA1c is used as a screening test:   >=6.5%   Diagnostic of Diabetes Mellitus           (if abnormal result  is confirmed)  5.7-6.4%   Increased risk of developing Diabetes Mellitus  References:Diagnosis and Classification of Diabetes Mellitus,Diabetes WERX,5400,86(PYPPJ 1):S62-S69 and Standards of Medical Care in         Diabetes - 2011,Diabetes Care,2011,34  (Suppl 1):S11-S61.   HGBA1C 6.5 05/06/2008     Lipid Panel ( most recent) Lipid Panel     Component Value Date/Time   CHOL  140 05/13/2008 2249   TRIG 145 05/13/2008 2249   HDL 44 05/13/2008 2249   CHOLHDL 3.2 Ratio 05/13/2008 2249   VLDL 29 05/13/2008 2249   LDLCALC 67 05/13/2008 2249        Assessment & Plan:   1. Uncontrolled type 2 diabetes mellitus with hyperglycemia ([place])  - Patient has currently uncontrolled symptomatic type 2 DM since 66 years of age,  with most recent A1c of 9 % from October 17, 2016. Recent labs reviewed.  -She does not report any gross complications for diabetes, however, RENESHA LIZAMA remains at a high risk for more acute and chronic complications which include CAD, CVA, CKD, retinopathy, and neuropathy. These are all discussed in detail with the patient.  - I have counseled her on diet management and weight loss, by adopting a carbohydrate restricted/protein rich diet.  - Suggestion is made for her to avoid simple carbohydrates  from her diet including Cakes, Sweet Desserts, Ice Cream, Soda (diet and regular), Sweet Tea, Candies, Chips, Cookies, Store Bought Juices, Alcohol in Excess of  1-2 drinks a day, Artificial Sweeteners, and "Sugar-free" Products. This will help patient to have stable blood glucose profile and potentially avoid unintended weight gain.  - I encouraged her to switch to  unprocessed or minimally processed complex starch and increased protein intake (animal or plant source), fruits, and vegetables.  - she is advised to stick to a routine mealtimes to eat 3 meals  a day and avoid unnecessary snacks ( to snack only to correct hypoglycemia).   - she will be scheduled with Jearld Fenton, RDN, CDE for individualized DM education.  - I have approached her with the following individualized plan to manage diabetes and patient agrees:   -Based on her current glycemic burden with A1c of 9%, she may need additional therapy with basal insulin. - However, it is essential to assure her commitment for proper monitoring of blood glucose for safe use of insulin. -I   will proceed to initiate strict monitoring of glucose 4 times a day-before meals and at bedtime, and she is advised to return in 1  week with her meter and logs for review. -If her documentations are consistent with significantly above target blood glucose profile, she will be considered for insulin treatment.  -Patient is encouraged to call clinic for blood glucose levels less than 70 or above 300 mg /dl. - I will continue Metformin 1000 mg p.o. twice daily, therapeutically suitable for patient . - she will also be considered for incretin therapy as appropriate next visit. - Patient specific target  A1c;  LDL, HDL, Triglycerides, and  Waist Circumference were discussed in detail.  2) BP/HTN: Uncontrolled. Continue current medications including ACEI/ARB. 3) Lipids/HPL:   Controlled.   Patient is advised to continue statins. 4)  Weight/Diet: CDE Consult will be initiated , exercise, and detailed carbohydrates information provided.  5) Chronic Care/Health Maintenance:  -she  is on ACEI/ARB and Statin medications and  is encouraged to continue to follow up with Ophthalmology, Dentist,  Podiatrist at least yearly or according to recommendations, and advised to  quit smoking. I have recommended yearly flu vaccine and pneumonia vaccination at least every 5 years; moderate intensity exercise for up to 150 minutes weekly; and  sleep for at least 7 hours a day.  - Time spent with the patient: 1 hour, of which >50% was spent in obtaining information about her symptoms, reviewing her previous labs, evaluations, and treatments, counseling her about her   currently uncontrolled type 2 diabetes, hyperlipidemia, hypertension, and developing a plan for long term treatment; her  questions were answered to her satisfaction.  - Patient to bring meter and  blood glucose logs during her next visit.  - I advised patient to maintain close follow up with Sharilyn Sites, MD for primary care needs.  Follow up plan: -  Return in about 1 week (around 11/22/2016) for follow up with meter and logs- no labs.  Glade Lloyd, MD [facility] [address] [place], [place] [address] Phone: [phone]  Fax: [phone]    11/15/2016, 12:05 PM  This note was partially dictated with voice recognition software. Similar sounding words can be transcribed inadequately or may not  be corrected upon review.

## 2016-11-15 NOTE — Patient Instructions (Signed)

## 2016-11-25 ENCOUNTER — Encounter: Payer: Self-pay | Admitting: "Endocrinology

## 2016-11-25 ENCOUNTER — Ambulatory Visit (INDEPENDENT_AMBULATORY_CARE_PROVIDER_SITE_OTHER): Payer: Medicare Other | Admitting: "Endocrinology

## 2016-11-25 VITALS — BP 155/77 | HR 68 | Ht 69.5 in | Wt 187.0 lb

## 2016-11-25 DIAGNOSIS — I10 Essential (primary) hypertension: Secondary | ICD-10-CM | POA: Diagnosis not present

## 2016-11-25 DIAGNOSIS — E1165 Type 2 diabetes mellitus with hyperglycemia: Secondary | ICD-10-CM

## 2016-11-25 DIAGNOSIS — E782 Mixed hyperlipidemia: Secondary | ICD-10-CM | POA: Diagnosis not present

## 2016-11-25 NOTE — Progress Notes (Signed)
Endocrinology follow-up Note       11/25/2016, 10:55 AM   Subjective:    Patient ID: Sandra Mitchell, female    DOB: 1950/04/21.  she is being seen in follow-up for management of currently uncontrolled symptomatic diabetes requested by  Sharilyn Sites, MD.   Past Medical History:  Diagnosis Date  . Diabetes mellitus without complication ([place])   . Hypercholesteremia   . Hypertension    Past Surgical History:  Procedure Laterality Date  . ABDOMINAL HYSTERECTOMY    . EXPLORATORY LAPAROTOMY    . Left knee arthroscopy     Social History   Socioeconomic History  . Marital status: Divorced    Spouse name: None  . Number of children: None  . Years of education: None  . Highest education level: None  Social Needs  . Financial resource strain: None  . Food insecurity - worry: None  . Food insecurity - inability: None  . Transportation needs - medical: None  . Transportation needs - non-medical: None  Occupational History  . None  Tobacco Use  . Smoking status: Current Every Day Smoker    Packs/day: 0.50    Years: 20.00    Pack years: 10.00    Types: Cigarettes  . Smokeless tobacco: Never Used  Substance and Sexual Activity  . Alcohol use: No  . Drug use: No  . Sexual activity: None  Other Topics Concern  . None  Social History Narrative  . None   Outpatient Encounter Medications as of 11/25/2016  Medication Sig  . amLODipine (NORVASC) 5 MG tablet Take 5 mg by mouth daily.  Marland Kitchen aspirin EC 81 MG tablet Take 81 mg by mouth daily.  . Cholecalciferol (VITAMIN D-3) 1000 UNITS CAPS Take 1 capsule by mouth daily.  Marland Kitchen losartan (COZAAR) 100 MG tablet Take 100 mg by mouth daily.  . metFORMIN (GLUCOPHAGE) 1000 MG tablet Take 1,000 mg by mouth 2 (two) times daily with a meal.  . Multiple Vitamins-Minerals (BIOTECT PLUS PO) Take by mouth.  . Omega 3 1000 MG CAPS Take by mouth.  . pravastatin  (PRAVACHOL) 20 MG tablet Take 20 mg by mouth daily.   No facility-administered encounter medications on file as of 11/25/2016.     ALLERGIES: Allergies  Allergen Reactions  . Ace Inhibitors     REACTION: cough  . Ampicillin     VACCINATION STATUS: Immunization History  Administered Date(s) Administered  . H1N1 12/04/2007  . Influenza Whole 11/07/2005, 11/14/2006, 10/23/2007  . Pneumococcal Polysaccharide-23 01/01/2007    Diabetes  She presents for her follow-up diabetic visit. She has type 2 diabetes mellitus. Onset time: she was diagned at approximate age of 66 years. Her disease course has been improving. There are no hypoglycemic associated symptoms. Pertinent negatives for hypoglycemia include no confusion, headaches, pallor or seizures. Associated symptoms include blurred vision, fatigue and foot paresthesias. Pertinent negatives for diabetes include no chest pain, no polydipsia, no polyphagia and no polyuria. There are no hypoglycemic complications. Symptoms are improving. There are no diabetic complications. Risk factors for coronary artery disease include diabetes mellitus, dyslipidemia, hypertension, obesity, post-menopausal, tobacco exposure and sedentary lifestyle.  Current diabetic treatment includes oral agent (monotherapy). Her weight is stable. She is following a generally unhealthy diet. When asked about meal planning, she reported none. She has not had a previous visit with a dietitian. She never participates in exercise. Her breakfast blood glucose range is generally 110-130 mg/dl. Her lunch blood glucose range is generally 130-140 mg/dl. Her dinner blood glucose range is generally 110-130 mg/dl. Her bedtime blood glucose range is generally 130-140 mg/dl. Her overall blood glucose range is 130-140 mg/dl. An ACE inhibitor/angiotensin II receptor blocker is being taken. She does not see a podiatrist.Eye exam is not current.  Hyperlipidemia  This is a chronic problem. The current  episode started more than 1 year ago. The problem is controlled. Exacerbating diseases include diabetes. Pertinent negatives include no chest pain, myalgias or shortness of breath. Current antihyperlipidemic treatment includes statins. Risk factors for coronary artery disease include diabetes mellitus, dyslipidemia, hypertension, a sedentary lifestyle, post-menopausal and family history.  Hypertension  This is a chronic problem. The current episode started more than 1 year ago. The problem is uncontrolled. Associated symptoms include blurred vision. Pertinent negatives include no chest pain, headaches, palpitations or shortness of breath. Risk factors for coronary artery disease include smoking/tobacco exposure, sedentary lifestyle, dyslipidemia, diabetes mellitus and family history. Past treatments include alpha 1 blockers.     Review of Systems  Constitutional: Positive for fatigue. Negative for chills, fever and unexpected weight change.  HENT: Negative for trouble swallowing and voice change.   Eyes: Positive for blurred vision. Negative for visual disturbance.  Respiratory: Negative for cough, shortness of breath and wheezing.   Cardiovascular: Negative for chest pain, palpitations and leg swelling.  Gastrointestinal: Negative for diarrhea, nausea and vomiting.  Endocrine: Negative for cold intolerance, heat intolerance, polydipsia, polyphagia and polyuria.  Musculoskeletal: Negative for arthralgias and myalgias.  Skin: Negative for color change, pallor, rash and wound.  Neurological: Negative for seizures and headaches.  Psychiatric/Behavioral: Negative for confusion and suicidal ideas.    Objective:    BP (!) 155/77   Pulse 68   Ht 5' 9.5" (1.765 m)   Wt 187 lb (84.8 kg)   BMI 27.22 kg/m   Wt Readings from Last 3 Encounters:  11/25/16 187 lb (84.8 kg)  11/15/16 187 lb (84.8 kg)  12/14/13 187 lb (84.8 kg)     Physical Exam  Constitutional: She is oriented to person, place,  and time. She appears well-developed.  HENT:  Head: Normocephalic and atraumatic.  Eyes: EOM are normal.  Neck: Normal range of motion. Neck supple. No tracheal deviation present. No thyromegaly present.  Cardiovascular: Normal rate and regular rhythm.  Pulmonary/Chest: Effort normal and breath sounds normal.  Abdominal: Soft. Bowel sounds are normal. There is no tenderness. There is no guarding.  Musculoskeletal: Normal range of motion. She exhibits no edema.  Neurological: She is alert and oriented to person, place, and time. She has normal reflexes. No cranial nerve deficit. Coordination normal.  Skin: Skin is warm and dry. No rash noted. No erythema. No pallor.  Psychiatric:  Dysphoric mood, reluctant and unconcerned affect.   CMP     Component Value Date/Time   NA 139 09/01/2009 1315   K 4.6 09/01/2009 1315   CL 108 09/01/2009 1315   CO2 26 09/01/2009 1315   GLUCOSE 115 (H) 09/01/2009 1315   BUN 12 09/01/2009 1315   CREATININE 0.91 09/01/2009 1315   CALCIUM 9.2 09/01/2009 1315   PROT 7.8 09/01/2009 1315   ALBUMIN 3.7 09/01/2009 1315  AST 20 09/01/2009 1315   ALT 23 09/01/2009 1315   ALKPHOS 65 09/01/2009 1315   BILITOT 0.5 09/01/2009 1315   GFRNONAA >60 09/01/2009 1315   GFRAA  09/01/2009 1315    >60        The eGFR has been calculated using the MDRD equation. This calculation has not been validated in all clinical situations. eGFR's persistently <60 mL/min signify possible Chronic Kidney Disease.     Diabetic Labs (most recent): Lab Results  Component Value Date   HGBA1C 9 10/17/2016   HGBA1C (H) 09/01/2009    9.8 (NOTE)                                                                       According to the ADA Clinical Practice Recommendations for 2011, when HbA1c is used as a screening test:   >=6.5%   Diagnostic of Diabetes Mellitus           (if abnormal result  is confirmed)  5.7-6.4%   Increased risk of developing Diabetes Mellitus  References:Diagnosis  and Classification of Diabetes Mellitus,Diabetes WIOX,7353,29(JMEQA 1):S62-S69 and Standards of Medical Care in         Diabetes - 2011,Diabetes Care,2011,34  (Suppl 1):S11-S61.   HGBA1C 6.5 05/06/2008     Lipid Panel ( most recent) Lipid Panel     Component Value Date/Time   CHOL 140 05/13/2008 2249   TRIG 145 05/13/2008 2249   HDL 44 05/13/2008 2249   CHOLHDL 3.2 Ratio 05/13/2008 2249   VLDL 29 05/13/2008 2249   LDLCALC 67 05/13/2008 2249      Assessment & Plan:   1. Uncontrolled type 2 diabetes mellitus with hyperglycemia ([place])  - Patient has currently uncontrolled symptomatic type 2 DM since 66 years of age,  with most recent A1c of 9 % from October 17, 2016. Recent labs reviewed. - Since her last visit, she came with dramatic improvement in her glycemic profile to target.  -She does not report any gross complications for diabetes, however, ROSELYNN WHITACRE remains at a high risk for more acute and chronic complications which include CAD, CVA, CKD, retinopathy, and neuropathy. These are all discussed in detail with the patient.  - I have counseled her on diet management and weight loss, by adopting a carbohydrate restricted/protein rich diet.  -  Suggestion is made for her to avoid simple carbohydrates  from her diet including Cakes, Sweet Desserts / Pastries, Ice Cream, Soda (diet and regular), Sweet Tea, Candies, Chips, Cookies, Store Bought Juices, Alcohol in Excess of  1-2 drinks a day, Artificial Sweeteners, and "Sugar-free" Products. This will help patient to have stable blood glucose profile and potentially avoid unintended weight gain.   - I encouraged her to switch to  unprocessed or minimally processed complex starch and increased protein intake (animal or plant source), fruits, and vegetables.  - she is advised to stick to a routine mealtimes to eat 3 meals  a day and avoid unnecessary snacks ( to snack only to correct hypoglycemia).   - she will be scheduled with  Jearld Fenton, RDN, CDE for individualized DM education- consult pending.  - I have approached her with the following individualized plan to manage diabetes and patient agrees:   -  Based on her current glycemic profile since her last visit, despite her recent A1c of 9%, she will not require insulin treatment at this time .  - Patient is encouraged to call clinic for blood glucose levels less than 70 or above 300 mg /dl. - I will continue Metformin 1000 mg p.o. twice daily, therapeutically suitable for patient . - she will also be considered for incretin therapy as appropriate next visit. - Patient specific target  A1c;  LDL, HDL, Triglycerides, and  Waist Circumference were discussed in detail.  2) BP/HTN: Uncontrolled. I advised her to continue current medications including  ACEI/ARB. 3) Lipids/HPL:   Controlled.   Patient is advised to continue statins. 4)  Weight/Diet: CDE Consult will be initiated , exercise, and detailed carbohydrates information provided.  5) Chronic Care/Health Maintenance:  -she  is on ACEI/ARB and Statin medications and  is encouraged to continue to follow up with Ophthalmology, Dentist,  Podiatrist at least yearly or according to recommendations, and advised to  quit smoking. I have recommended yearly flu vaccine and pneumonia vaccination at least every 5 years; moderate intensity exercise for up to 150 minutes weekly; and  sleep for at least 7 hours a day.  - Time spent with the patient: 25 min, of which >50% was spent in reviewing her sugar logs , discussing her hypo- and hyper-glycemic episodes, reviewing her current and  previous labs and insulin doses and developing a plan to avoid hypo- and hyper-glycemia.   - I advised patient to maintain close follow up with Sharilyn Sites, MD for primary care needs.  Follow up plan: - Return in about 9 weeks (around 01/27/2017) for follow up with pre-visit labs.  Glade Lloyd, MD [facility] [address] [place], [place] [address] Phone: [phone]  Fax: [phone]    11/25/2016, 10:55 AM  This note was partially dictated with voice recognition software. Similar sounding words can be transcribed inadequately or may not  be corrected upon review.

## 2017-01-06 ENCOUNTER — Ambulatory Visit: Payer: Medicare Other | Admitting: Nutrition

## 2017-01-17 ENCOUNTER — Other Ambulatory Visit: Payer: Self-pay | Admitting: "Endocrinology

## 2017-01-17 DIAGNOSIS — E1165 Type 2 diabetes mellitus with hyperglycemia: Secondary | ICD-10-CM | POA: Diagnosis not present

## 2017-01-17 DIAGNOSIS — E782 Mixed hyperlipidemia: Secondary | ICD-10-CM | POA: Diagnosis not present

## 2017-01-23 LAB — COMPREHENSIVE METABOLIC PANEL
A/G RATIO: 1.4 (ref 1.2–2.2)
ALT: 17 IU/L (ref 0–32)
AST: 17 IU/L (ref 0–40)
Albumin: 4.2 g/dL (ref 3.6–4.8)
Alkaline Phosphatase: 66 IU/L (ref 39–117)
BUN/Creatinine Ratio: 14 (ref 12–28)
BUN: 11 mg/dL (ref 8–27)
Bilirubin Total: 0.3 mg/dL (ref 0.0–1.2)
CALCIUM: 9.2 mg/dL (ref 8.7–10.3)
CO2: 23 mmol/L (ref 20–29)
Chloride: 105 mmol/L (ref 96–106)
Creatinine, Ser: 0.78 mg/dL (ref 0.57–1.00)
GFR calc Af Amer: 92 mL/min/{1.73_m2} (ref >59)
GFR, EST NON AFRICAN AMERICAN: 80 mL/min/{1.73_m2} (ref >59)
Globulin, Total: 3 g/dL (ref 1.5–4.5)
Glucose: 104 mg/dL — ABNORMAL HIGH (ref 65–99)
POTASSIUM: 4.9 mmol/L (ref 3.5–5.2)
SODIUM: 142 mmol/L (ref 134–144)
Total Protein: 7.2 g/dL (ref 6.0–8.5)

## 2017-01-23 LAB — TSH+FREE T4
Free T4 by Dialysis: 1.1 ng/dL
TSH-ICMA: 3.7 uU/mL

## 2017-01-23 LAB — LIPID PANEL W/O CHOL/HDL RATIO
Cholesterol, Total: 194 mg/dL (ref 100–199)
HDL: 47 mg/dL
LDL Calculated: 121 mg/dL — ABNORMAL HIGH (ref 0–99)
Triglycerides: 129 mg/dL (ref 0–149)
VLDL Cholesterol Cal: 26 mg/dL (ref 5–40)

## 2017-01-23 LAB — HGB A1C W/O EAG: HEMOGLOBIN A1C: 7.7 % — AB (ref 4.8–5.6)

## 2017-01-23 LAB — MICROALBUMIN / CREATININE URINE RATIO
Creatinine, Urine: 99.7 mg/dL
Microalb/Creat Ratio: <3 mg/g creat (ref 0.0–30.0)

## 2017-01-27 ENCOUNTER — Ambulatory Visit (INDEPENDENT_AMBULATORY_CARE_PROVIDER_SITE_OTHER): Payer: Medicare HMO | Admitting: "Endocrinology

## 2017-01-27 ENCOUNTER — Encounter: Payer: Self-pay | Admitting: "Endocrinology

## 2017-01-27 VITALS — BP 151/80 | HR 64 | Ht 69.5 in | Wt 186.0 lb

## 2017-01-27 DIAGNOSIS — E1165 Type 2 diabetes mellitus with hyperglycemia: Secondary | ICD-10-CM

## 2017-01-27 DIAGNOSIS — I10 Essential (primary) hypertension: Secondary | ICD-10-CM

## 2017-01-27 DIAGNOSIS — E782 Mixed hyperlipidemia: Secondary | ICD-10-CM

## 2017-01-27 NOTE — Patient Instructions (Signed)

## 2017-01-27 NOTE — Progress Notes (Signed)
Endocrinology follow-up Note       01/27/2017, 11:40 AM   Subjective:    Patient ID: Sandra Mitchell, female    DOB: 07-Feb-1950.  she is being seen in follow-up for management of currently uncontrolled symptomatic diabetes requested by  Assunta Found, MD.   Past Medical History:  Diagnosis Date  . Diabetes mellitus without complication ([facility])   . Hypercholesteremia   . Hypertension    Past Surgical History:  Procedure Laterality Date  . ABDOMINAL HYSTERECTOMY    . COLONOSCOPY N/A 12/14/2013   Procedure: COLONOSCOPY;  Surgeon: Dalia Heading, MD;  Location: [facility];  Service: Gastroenterology;  Laterality: N/A;  . EXPLORATORY LAPAROTOMY    . Left knee arthroscopy     Social History   Socioeconomic History  . Marital status: Divorced    Spouse name: None  . Number of children: None  . Years of education: None  . Highest education level: None  Social Needs  . Financial resource strain: None  . Food insecurity - worry: None  . Food insecurity - inability: None  . Transportation needs - medical: None  . Transportation needs - non-medical: None  Occupational History  . None  Tobacco Use  . Smoking status: Current Every Day Smoker    Packs/day: 0.50    Years: 20.00    Pack years: 10.00    Types: Cigarettes  . Smokeless tobacco: Never Used  Substance and Sexual Activity  . Alcohol use: No  . Drug use: No  . Sexual activity: None  Other Topics Concern  . None  Social History Narrative  . None   Outpatient Encounter Medications as of 01/27/2017  Medication Sig  . amLODipine (NORVASC) 5 MG tablet Take 5 mg by mouth daily.  Marland Kitchen aspirin EC 81 MG tablet Take 81 mg by mouth daily.  . Cholecalciferol (VITAMIN D-3) 1000 UNITS CAPS Take 1 capsule by mouth daily.  Marland Kitchen losartan (COZAAR) 100 MG tablet Take 100 mg by mouth daily.  . metFORMIN (GLUCOPHAGE) 1000 MG tablet Take 500 mg by mouth 2 (two)  times daily with a meal.  . Multiple Vitamins-Minerals (BIOTECT PLUS PO) Take by mouth.  . Omega 3 1000 MG CAPS Take by mouth.  . pravastatin (PRAVACHOL) 20 MG tablet Take 20 mg by mouth daily.   No facility-administered encounter medications on file as of 01/27/2017.     ALLERGIES: Allergies  Allergen Reactions  . Ace Inhibitors     REACTION: cough  . Ampicillin     VACCINATION STATUS: Immunization History  Administered Date(s) Administered  . H1N1 12/04/2007  . Influenza Whole 11/07/2005, 11/14/2006, 10/23/2007  . Pneumococcal Polysaccharide-23 01/01/2007    Diabetes  She presents for her follow-up diabetic visit. She has type 2 diabetes mellitus. Onset time: she was diagned at approximate age of 51 years. Her disease course has been improving. There are no hypoglycemic associated symptoms. Pertinent negatives for hypoglycemia include no confusion, headaches, pallor or seizures. Associated symptoms include blurred vision, fatigue and foot paresthesias. Pertinent negatives for diabetes include no chest pain, no polydipsia, no polyphagia and no polyuria. There are no hypoglycemic complications.  Symptoms are improving. There are no diabetic complications. Risk factors for coronary artery disease include diabetes mellitus, dyslipidemia, hypertension, obesity, post-menopausal, tobacco exposure and sedentary lifestyle. Current diabetic treatment includes oral agent (monotherapy). Her weight is stable. She is following a generally unhealthy diet. When asked about meal planning, she reported none. She has not had a previous visit with a dietitian. She never participates in exercise. Her breakfast blood glucose range is generally 110-130 mg/dl. An ACE inhibitor/angiotensin II receptor blocker is being taken. She does not see a podiatrist.Eye exam is not current.  Hyperlipidemia  This is a chronic problem. The current episode started more than 1 year ago. The problem is controlled. Exacerbating  diseases include diabetes. Pertinent negatives include no chest pain, myalgias or shortness of breath. Current antihyperlipidemic treatment includes statins. Risk factors for coronary artery disease include diabetes mellitus, dyslipidemia, hypertension, a sedentary lifestyle, post-menopausal and family history.  Hypertension  This is a chronic problem. The current episode started more than 1 year ago. The problem is uncontrolled. Associated symptoms include blurred vision. Pertinent negatives include no chest pain, headaches, palpitations or shortness of breath. Risk factors for coronary artery disease include smoking/tobacco exposure, sedentary lifestyle, dyslipidemia, diabetes mellitus and family history. Past treatments include alpha 1 blockers.    Review of Systems  Constitutional: Positive for fatigue. Negative for chills, fever and unexpected weight change.  HENT: Negative for trouble swallowing and voice change.   Eyes: Positive for blurred vision. Negative for visual disturbance.  Respiratory: Negative for cough, shortness of breath and wheezing.   Cardiovascular: Negative for chest pain, palpitations and leg swelling.  Gastrointestinal: Negative for diarrhea, nausea and vomiting.  Endocrine: Negative for cold intolerance, heat intolerance, polydipsia, polyphagia and polyuria.  Musculoskeletal: Negative for arthralgias and myalgias.  Skin: Negative for color change, pallor, rash and wound.  Neurological: Negative for seizures and headaches.  Psychiatric/Behavioral: Negative for confusion and suicidal ideas.    Objective:    BP (!) 151/80   Pulse 64   Ht 5' 9.5" (1.765 m)   Wt 186 lb (84.4 kg)   BMI 27.07 kg/m   Wt Readings from Last 3 Encounters:  01/27/17 186 lb (84.4 kg)  11/25/16 187 lb (84.8 kg)  11/15/16 187 lb (84.8 kg)     Physical Exam  Constitutional: She is oriented to person, place, and time. She appears well-developed.  HENT:  Head: Normocephalic and  atraumatic.  Eyes: EOM are normal.  Neck: Normal range of motion. Neck supple. No tracheal deviation present. No thyromegaly present.  Cardiovascular: Normal rate and regular rhythm.  Pulmonary/Chest: Effort normal and breath sounds normal.  Abdominal: Soft. Bowel sounds are normal. There is no tenderness. There is no guarding.  Musculoskeletal: Normal range of motion. She exhibits no edema.  Neurological: She is alert and oriented to person, place, and time. She has normal reflexes. No cranial nerve deficit. Coordination normal.  Skin: Skin is warm and dry. No rash noted. No erythema. No pallor.  Psychiatric:  Dysphoric mood, reluctant and unconcerned affect.   CMP     Component Value Date/Time   NA 142 01/17/2017 1257   K 4.9 01/17/2017 1257   CL 105 01/17/2017 1257   CO2 23 01/17/2017 1257   GLUCOSE 104 (H) 01/17/2017 1257   GLUCOSE 115 (H) 09/01/2009 1315   BUN 11 01/17/2017 1257   CREATININE 0.78 01/17/2017 1257   CALCIUM 9.2 01/17/2017 1257   PROT 7.2 01/17/2017 1257   ALBUMIN 4.2 01/17/2017 1257   AST  17 01/17/2017 1257   ALT 17 01/17/2017 1257   ALKPHOS 66 01/17/2017 1257   BILITOT 0.3 01/17/2017 1257   GFRNONAA 80 01/17/2017 1257   GFRAA 92 01/17/2017 1257     Diabetic Labs (most recent): Lab Results  Component Value Date   HGBA1C 7.7 (H) 01/17/2017   HGBA1C 9 10/17/2016   HGBA1C (H) 09/01/2009    9.8 (NOTE)                                                                       According to the ADA Clinical Practice Recommendations for 2011, when HbA1c is used as a screening test:   >=6.5%   Diagnostic of Diabetes Mellitus           (if abnormal result  is confirmed)  5.7-6.4%   Increased risk of developing Diabetes Mellitus  References:Diagnosis and Classification of Diabetes Mellitus,Diabetes Care,2011,34(Suppl 1):S62-S69 and Standards of Medical Care in         Diabetes - 2011,Diabetes Care,2011,34  (Suppl 1):S11-S61.     Lipid Panel ( most recent) Lipid  Panel     Component Value Date/Time   CHOL 194 01/17/2017 1257   TRIG 129 01/17/2017 1257   HDL 47 01/17/2017 1257   CHOLHDL 3.2 Ratio 05/13/2008 2249   VLDL 29 05/13/2008 2249   LDLCALC 121 (H) 01/17/2017 1257      Assessment & Plan:   1. Uncontrolled type 2 diabetes mellitus with hyperglycemia ([facility])  - Patient has currently uncontrolled symptomatic type 2 DM since 67 years of age. - She came with controlled fasting blood glucose profile with A1c improving to 7.7% from 9%. - Recent labs reviewed. - Since her last visit, she came with dramatic improvement in her glycemic profile to target.  -She does not report any gross complications for diabetes, however, Sandra Mitchell remains at a high risk for more acute and chronic complications which include CAD, CVA, CKD, retinopathy, and neuropathy. These are all discussed in detail with the patient.  - I have counseled her on diet management and weight loss, by adopting a carbohydrate restricted/protein rich diet.  -  Suggestion is made for her to avoid simple carbohydrates  from her diet including Cakes, Sweet Desserts / Pastries, Ice Cream, Soda (diet and regular), Sweet Tea, Candies, Chips, Cookies, Store Bought Juices, Alcohol in Excess of  1-2 drinks a day, Artificial Sweeteners, and "Sugar-free" Products. This will help patient to have stable blood glucose profile and potentially avoid unintended weight gain.   - I encouraged her to switch to  unprocessed or minimally processed complex starch and increased protein intake (animal or plant source), fruits, and vegetables.  - she is advised to stick to a routine mealtimes to eat 3 meals  a day and avoid unnecessary snacks ( to snack only to correct hypoglycemia).   - she will be scheduled with Norm Salt, RDN, CDE for individualized DM education- consult pending.  - I have approached her with the following individualized plan to manage diabetes and patient agrees:   -Based on  her current glycemic profile since her last visit, , she will not require insulin treatment at this time .  - Patient is encouraged to call clinic for blood glucose  levels less than 70 or above 300 mg /dl. - She has some GI intolerance to full dose metformin. I advised her to lower metformin to 500 mg by mouth twice a day with meals, therapeutically suitable for patient . - she will also be considered for incretin therapy as appropriate next visit. - Patient specific target  A1c;  LDL, HDL, Triglycerides, and  Waist Circumference were discussed in detail.  2) BP/HTN: Uncontrolled type near target, I advised her to continue current medications including  ACEI/ARB. 3) Lipids/HPL:   Controlled.   Patient is advised to continue statins. 4)  Weight/Diet: CDE Consult will be initiated , exercise, and detailed carbohydrates information provided.  5) Chronic Care/Health Maintenance:  -she  is on ACEI/ARB and Statin medications and  is encouraged to continue to follow up with Ophthalmology, Dentist,  Podiatrist at least yearly or according to recommendations, and advised to  quit smoking. I have recommended yearly flu vaccine and pneumonia vaccination at least every 5 years; moderate intensity exercise for up to 150 minutes weekly; and  sleep for at least 7 hours a day.  - Time spent with the patient: 25 min, of which >50% was spent in reviewing her blood glucose logs , discussing her hypo- and hyper-glycemic episodes, reviewing her current and  previous labs and insulin doses and developing a plan to avoid hypo- and hyper-glycemia. Please refer to Patient Instructions for Blood Glucose Monitoring and Insulin/Medications Dosing Guide"  in media tab for additional information.  - I advised patient to maintain close follow up with Assunta FoundGolding, John, MD for primary care needs.  Follow up plan: - Return in about 4 months (around 05/27/2017) for meter, and logs.  Marquis LunchGebre Nida, MD [facility] [address] [place], [place] [address] Phone: [phone]  Fax: [phone]    01/27/2017, 11:40 AM  This note was partially dictated with voice recognition software. Similar sounding words can be transcribed inadequately or may not  be corrected upon review.

## 2017-02-18 ENCOUNTER — Ambulatory Visit: Payer: Medicare Other | Admitting: Nutrition

## 2017-05-20 ENCOUNTER — Other Ambulatory Visit: Payer: Self-pay | Admitting: "Endocrinology

## 2017-05-20 DIAGNOSIS — E1165 Type 2 diabetes mellitus with hyperglycemia: Secondary | ICD-10-CM | POA: Diagnosis not present

## 2017-05-21 LAB — COMPREHENSIVE METABOLIC PANEL
A/G RATIO: 1.4 (ref 1.2–2.2)
ALT: 11 IU/L (ref 0–32)
AST: 13 IU/L (ref 0–40)
Albumin: 4.1 g/dL (ref 3.6–4.8)
Alkaline Phosphatase: 67 IU/L (ref 39–117)
BUN/Creatinine Ratio: 15 (ref 12–28)
BUN: 13 mg/dL (ref 8–27)
Bilirubin Total: 0.3 mg/dL (ref 0.0–1.2)
CALCIUM: 9.4 mg/dL (ref 8.7–10.3)
CO2: 23 mmol/L (ref 20–29)
Chloride: 99 mmol/L (ref 96–106)
Creatinine, Ser: 0.88 mg/dL (ref 0.57–1.00)
GFR calc Af Amer: 79 mL/min/{1.73_m2} (ref >59)
GFR, EST NON AFRICAN AMERICAN: 69 mL/min/{1.73_m2} (ref >59)
Globulin, Total: 2.9 g/dL (ref 1.5–4.5)
Glucose: 154 mg/dL — ABNORMAL HIGH (ref 65–99)
POTASSIUM: 4.6 mmol/L (ref 3.5–5.2)
Sodium: 138 mmol/L (ref 134–144)
Total Protein: 7 g/dL (ref 6.0–8.5)

## 2017-05-21 LAB — HGB A1C W/O EAG: Hgb A1c MFr Bld: 7.3 % — ABNORMAL HIGH (ref 4.8–5.6)

## 2017-05-27 ENCOUNTER — Encounter: Payer: Self-pay | Admitting: "Endocrinology

## 2017-05-27 ENCOUNTER — Ambulatory Visit (INDEPENDENT_AMBULATORY_CARE_PROVIDER_SITE_OTHER): Payer: Medicare HMO | Admitting: "Endocrinology

## 2017-05-27 VITALS — BP 157/73 | HR 73 | Ht 69.5 in | Wt 184.0 lb

## 2017-05-27 DIAGNOSIS — I10 Essential (primary) hypertension: Secondary | ICD-10-CM

## 2017-05-27 DIAGNOSIS — E1165 Type 2 diabetes mellitus with hyperglycemia: Secondary | ICD-10-CM | POA: Diagnosis not present

## 2017-05-27 DIAGNOSIS — E782 Mixed hyperlipidemia: Secondary | ICD-10-CM | POA: Diagnosis not present

## 2017-05-27 DIAGNOSIS — F17200 Nicotine dependence, unspecified, uncomplicated: Secondary | ICD-10-CM

## 2017-05-27 MED ORDER — GLUCOSE BLOOD VI STRP: 1.0000 | ORAL_STRIP | Freq: Two times a day (BID) | 5 refills | Status: AC

## 2017-05-27 NOTE — Patient Instructions (Signed)

## 2017-05-27 NOTE — Progress Notes (Signed)
Endocrinology follow-up Note       05/27/2017, 10:50 AM   Subjective:    Patient ID: Sandra Mitchell, female    DOB: [birth year].  she is being seen in follow-up for management of currently uncontrolled symptomatic diabetes requested by  Assunta Found, MD.   Past Medical History:  Diagnosis Date  . Diabetes mellitus without complication (HCC)   . Hypercholesteremia   . Hypertension    Past Surgical History:  Procedure Laterality Date  . ABDOMINAL HYSTERECTOMY    . COLONOSCOPY N/A 12/14/2013   Procedure: COLONOSCOPY;  Surgeon: Dalia Heading, MD;  Location: [facility];  Service: Gastroenterology;  Laterality: N/A;  . EXPLORATORY LAPAROTOMY    . Left knee arthroscopy     Social History   Socioeconomic History  . Marital status: Divorced    Spouse name: Not on file  . Number of children: Not on file  . Years of education: Not on file  . Highest education level: Not on file  Occupational History  . Not on file  Social Needs  . Financial resource strain: Not on file  . Food insecurity:    Worry: Not on file    Inability: Not on file  . Transportation needs:    Medical: Not on file    Non-medical: Not on file  Tobacco Use  . Smoking status: Current Every Day Smoker    Packs/day: 0.50    Years: 20.00    Pack years: 10.00    Types: Cigarettes  . Smokeless tobacco: Never Used  Substance and Sexual Activity  . Alcohol use: No  . Drug use: No  . Sexual activity: Not on file  Lifestyle  . Physical activity:    Days per week: Not on file    Minutes per session: Not on file  . Stress: Not on file  Relationships  . Social connections:    Talks on phone: Not on file    Gets together: Not on file    Attends religious service: Not on file    Active member of club or organization: Not on file    Attends meetings of clubs or organizations: Not on file    Relationship status: Not on file   Other Topics Concern  . Not on file  Social History Narrative  . Not on file   Outpatient Encounter Medications as of 05/27/2017  Medication Sig  . glucose blood test strip 1 each by Other route 2 (two) times daily. Use as instructed 4 x daily. E11.65 Bayer Contour test strips  . [DISCONTINUED] glucose blood test strip 1 each by Other route 2 (two) times daily. Use as instructed 4 x daily. E11.65  . amLODipine (NORVASC) 5 MG tablet Take 5 mg by mouth daily.  Marland Kitchen aspirin EC 81 MG tablet Take 81 mg by mouth daily.  . Cholecalciferol (VITAMIN D-3) 1000 UNITS CAPS Take 1 capsule by mouth daily.  Marland Kitchen losartan (COZAAR) 100 MG tablet Take 100 mg by mouth daily.  . metFORMIN (GLUCOPHAGE) 1000 MG tablet Take 500 mg by mouth 2 (two) times daily with a meal.  . Multiple Vitamins-Minerals (BIOTECT PLUS  PO) Take by mouth.  . Omega 3 1000 MG CAPS Take by mouth.  . pravastatin (PRAVACHOL) 20 MG tablet Take 20 mg by mouth daily.   No facility-administered encounter medications on file as of 05/27/2017.     ALLERGIES: Allergies  Allergen Reactions  . Ace Inhibitors     REACTION: cough  . Ampicillin     VACCINATION STATUS: Immunization History  Administered Date(s) Administered  . H1N1 12/04/2007  . Influenza Whole 11/07/2005, 11/14/2006, 10/23/2007  . Pneumococcal Polysaccharide-23 01/01/2007    Diabetes  She presents for her follow-up diabetic visit. She has type 2 diabetes mellitus. Onset time: she was diagned at approximate age of 51 years. Her disease course has been improving. There are no hypoglycemic associated symptoms. Pertinent negatives for hypoglycemia include no confusion, headaches, pallor or seizures. Associated symptoms include blurred vision, fatigue and foot paresthesias. Pertinent negatives for diabetes include no chest pain, no polydipsia, no polyphagia and no polyuria. There are no hypoglycemic complications. Symptoms are improving. There are no diabetic complications. Risk  factors for coronary artery disease include diabetes mellitus, dyslipidemia, hypertension, obesity, post-menopausal, tobacco exposure and sedentary lifestyle. Current diabetic treatment includes oral agent (monotherapy). Her weight is stable. She is following a generally unhealthy diet. When asked about meal planning, she reported none. She has not had a previous visit with a dietitian. She never participates in exercise. Her breakfast blood glucose range is generally 110-130 mg/dl. An ACE inhibitor/angiotensin II receptor blocker is being taken. She does not see a podiatrist.Eye exam is not current.  Hyperlipidemia  This is a chronic problem. The current episode started more than 1 year ago. The problem is controlled. Exacerbating diseases include diabetes. Pertinent negatives include no chest pain, myalgias or shortness of breath. Current antihyperlipidemic treatment includes statins. Risk factors for coronary artery disease include diabetes mellitus, dyslipidemia, hypertension, a sedentary lifestyle, post-menopausal and family history.  Hypertension  This is a chronic problem. The current episode started more than 1 year ago. The problem is uncontrolled. Associated symptoms include blurred vision. Pertinent negatives include no chest pain, headaches, palpitations or shortness of breath. Risk factors for coronary artery disease include smoking/tobacco exposure, sedentary lifestyle, dyslipidemia, diabetes mellitus and family history. Past treatments include alpha 1 blockers.    Review of Systems  Constitutional: Positive for fatigue. Negative for chills, fever and unexpected weight change.  HENT: Negative for trouble swallowing and voice change.   Eyes: Positive for blurred vision. Negative for visual disturbance.  Respiratory: Negative for cough, shortness of breath and wheezing.   Cardiovascular: Negative for chest pain, palpitations and leg swelling.  Gastrointestinal: Negative for diarrhea, nausea  and vomiting.  Endocrine: Negative for cold intolerance, heat intolerance, polydipsia, polyphagia and polyuria.  Musculoskeletal: Negative for arthralgias and myalgias.  Skin: Negative for color change, pallor, rash and wound.  Neurological: Negative for seizures and headaches.  Psychiatric/Behavioral: Negative for confusion and suicidal ideas.    Objective:    BP (!) 157/73   Pulse 73   Ht 5' 9.5" (1.765 m)   Wt 184 lb (83.5 kg)   BMI 26.78 kg/m   Wt Readings from Last 3 Encounters:  05/27/17 184 lb (83.5 kg)  01/27/17 186 lb (84.4 kg)  11/25/16 187 lb (84.8 kg)     Physical Exam  Constitutional: She is oriented to person, place, and time. She appears well-developed.  HENT:  Head: Normocephalic and atraumatic.  Eyes: EOM are normal.  Neck: Normal range of motion. Neck supple. No tracheal deviation present.  No thyromegaly present.  Cardiovascular: Normal rate.  Pulmonary/Chest: Effort normal.  Abdominal: There is no tenderness. There is no guarding.  Musculoskeletal: Normal range of motion. She exhibits no edema.  Neurological: She is alert and oriented to person, place, and time. She has normal reflexes. No cranial nerve deficit. Coordination normal.  Skin: Skin is warm and dry. No rash noted. No erythema. No pallor.  Psychiatric: Judgment normal.   CMP     Component Value Date/Time   NA 138 05/20/2017 1329   K 4.6 05/20/2017 1329   CL 99 05/20/2017 1329   CO2 23 05/20/2017 1329   GLUCOSE 154 (H) 05/20/2017 1329   GLUCOSE 115 (H) 09/01/2009 1315   BUN 13 05/20/2017 1329   CREATININE 0.88 05/20/2017 1329   CALCIUM 9.4 05/20/2017 1329   PROT 7.0 05/20/2017 1329   ALBUMIN 4.1 05/20/2017 1329   AST 13 05/20/2017 1329   ALT 11 05/20/2017 1329   ALKPHOS 67 05/20/2017 1329   BILITOT 0.3 05/20/2017 1329   GFRNONAA 69 05/20/2017 1329   GFRAA 79 05/20/2017 1329     Diabetic Labs (most recent): Lab Results  Component Value Date   HGBA1C 7.3 (H) 05/20/2017   HGBA1C  7.7 (H) 01/17/2017   HGBA1C 9 10/17/2016     Lipid Panel ( most recent) Lipid Panel     Component Value Date/Time   CHOL 194 01/17/2017 1257   TRIG 129 01/17/2017 1257   HDL 47 01/17/2017 1257   CHOLHDL 3.2 Ratio 05/13/2008 2249   VLDL 29 05/13/2008 2249   LDLCALC 121 (H) 01/17/2017 1257      Assessment & Plan:   1. Uncontrolled type 2 diabetes mellitus with hyperglycemia (HCC)  - Patient has currently uncontrolled symptomatic type 2 DM since 67 years of age. - She came with controlled fasting blood glucose profile with A1c improving to 7.3 %, overall improved from 9%. - Recent labs reviewed.   -She does not report any gross complications for diabetes, however, Sandra Mitchell remains at a high risk for more acute and chronic complications which include CAD, CVA, CKD, retinopathy, and neuropathy. These are all discussed in detail with the patient.  - I have counseled her on diet management and weight loss, by adopting a carbohydrate restricted/protein rich diet.  -  Suggestion is made for her to avoid simple carbohydrates  from her diet including Cakes, Sweet Desserts / Pastries, Ice Cream, Soda (diet and regular), Sweet Tea, Candies, Chips, Cookies, Store Bought Juices, Alcohol in Excess of  1-2 drinks a day, Artificial Sweeteners, and "Sugar-free" Products. This will help patient to have stable blood glucose profile and potentially avoid unintended weight gain.  - I encouraged her to switch to  unprocessed or minimally processed complex starch and increased protein intake (animal or plant source), fruits, and vegetables.  - she is advised to stick to a routine mealtimes to eat 3 meals  a day and avoid unnecessary snacks ( to snack only to correct hypoglycemia).   - she will be scheduled with Norm Salt, RDN, CDE for individualized DM education- consult pending.  - I have approached her with the following individualized plan to manage diabetes and patient agrees:    -Based on her current glycemic profile since her last visit,  she will not require insulin treatment at this time .  - Patient is encouraged to call clinic for blood glucose levels less than 70 or above 300 mg /dl. - She has done very well with lower  dose of metformin, GI side effects resolved.  -I discussed and continued metformin 500 mg p.o. 3 times daily after meals, therapeutically suitable for patient .  - she will also be considered for incretin therapy if she loses control.   - Patient specific target  A1c;  LDL, HDL, Triglycerides, and  Waist Circumference were discussed in detail.  2) BP/HTN: Her blood pressure is not controlled to target.  She is a chronic heavy smoker.  She is advised to consider smoking cessation.  I advised her to continue current medications including  ACEI/ARB.  3) Lipids/HPL: Her recent lipid panel showed uncontrolled LDL at 121.  She is advised to continue pravastatin 20 mg p.o. Nightly.  4)  Weight/Diet: CDE Consult has been initiated , exercise, and detailed carbohydrates information provided.  5) Chronic Care/Health Maintenance:  -she  is on ACEI/ARB and Statin medications and  is encouraged to continue to follow up with Ophthalmology, Dentist,  Podiatrist at least yearly or according to recommendations, and advised to  quit smoking. I have recommended yearly flu vaccine and pneumonia vaccination at least every 5 years; moderate intensity exercise for up to 150 minutes weekly; and  sleep for at least 7 hours a day.   - I advised patient to maintain close follow up with Assunta Found, MD for primary care needs.   - Time spent with the patient: 25 min, of which >50% was spent in reviewing her blood glucose logs , discussing her hypo- and hyper-glycemic episodes, reviewing her current and  previous labs and insulin doses and developing a plan to avoid hypo- and hyper-glycemia. Please refer to Patient Instructions for Blood Glucose Monitoring and  Insulin/Medications Dosing Guide"  in media tab for additional information. Rennis Harding participated in the discussions, expressed understanding, and voiced agreement with the above plans.  All questions were answered to her satisfaction. she is encouraged to contact clinic should she have any questions or concerns prior to her return visit.  Follow up plan: - Return in about 4 months (around 09/27/2017) for follow up with pre-visit labs.  Marquis Lunch, MD [facility] [address] [place], [place] [address] Phone: [phone]  Fax: [phone]    05/27/2017, 10:50 AM  This note was partially dictated with voice recognition software. Similar sounding words can be transcribed inadequately or may not  be corrected upon review.

## 2017-07-01 DIAGNOSIS — Z6826 Body mass index (BMI) 26.0-26.9, adult: Secondary | ICD-10-CM | POA: Diagnosis not present

## 2017-07-01 DIAGNOSIS — Z0001 Encounter for general adult medical examination with abnormal findings: Secondary | ICD-10-CM | POA: Diagnosis not present

## 2017-07-01 DIAGNOSIS — I10 Essential (primary) hypertension: Secondary | ICD-10-CM | POA: Diagnosis not present

## 2017-07-01 DIAGNOSIS — E785 Hyperlipidemia, unspecified: Secondary | ICD-10-CM | POA: Diagnosis not present

## 2017-07-01 DIAGNOSIS — Z1389 Encounter for screening for other disorder: Secondary | ICD-10-CM | POA: Diagnosis not present

## 2017-07-01 DIAGNOSIS — E663 Overweight: Secondary | ICD-10-CM | POA: Diagnosis not present

## 2017-07-02 DIAGNOSIS — E1165 Type 2 diabetes mellitus with hyperglycemia: Secondary | ICD-10-CM | POA: Diagnosis not present

## 2017-07-02 DIAGNOSIS — E1140 Type 2 diabetes mellitus with diabetic neuropathy, unspecified: Secondary | ICD-10-CM | POA: Diagnosis not present

## 2017-08-08 ENCOUNTER — Other Ambulatory Visit (HOSPITAL_COMMUNITY): Payer: Self-pay | Admitting: Family Medicine

## 2017-08-08 DIAGNOSIS — Z1231 Encounter for screening mammogram for malignant neoplasm of breast: Secondary | ICD-10-CM

## 2017-08-18 ENCOUNTER — Encounter (HOSPITAL_COMMUNITY): Payer: Self-pay

## 2017-08-18 ENCOUNTER — Ambulatory Visit (HOSPITAL_COMMUNITY)
Admission: RE | Admit: 2017-08-18 | Discharge: 2017-08-18 | Disposition: A | Discharge status: Home / Self Care | Payer: Medicare HMO | Source: Ambulatory Visit | Attending: Family Medicine | Admitting: Family Medicine

## 2017-08-18 DIAGNOSIS — Z1231 Encounter for screening mammogram for malignant neoplasm of breast: Secondary | ICD-10-CM | POA: Diagnosis not present

## 2017-09-29 ENCOUNTER — Ambulatory Visit: Payer: Medicare HMO | Admitting: "Endocrinology

## 2017-11-10 DIAGNOSIS — Z719 Counseling, unspecified: Secondary | ICD-10-CM | POA: Diagnosis not present

## 2017-11-10 DIAGNOSIS — J302 Other seasonal allergic rhinitis: Secondary | ICD-10-CM | POA: Diagnosis not present

## 2017-11-10 DIAGNOSIS — E785 Hyperlipidemia, unspecified: Secondary | ICD-10-CM | POA: Diagnosis not present

## 2017-11-10 DIAGNOSIS — I10 Essential (primary) hypertension: Secondary | ICD-10-CM | POA: Diagnosis not present

## 2017-11-10 DIAGNOSIS — E663 Overweight: Secondary | ICD-10-CM | POA: Diagnosis not present

## 2017-11-10 DIAGNOSIS — E1140 Type 2 diabetes mellitus with diabetic neuropathy, unspecified: Secondary | ICD-10-CM | POA: Diagnosis not present

## 2017-11-10 DIAGNOSIS — Z6826 Body mass index (BMI) 26.0-26.9, adult: Secondary | ICD-10-CM | POA: Diagnosis not present

## 2017-11-10 DIAGNOSIS — Z23 Encounter for immunization: Secondary | ICD-10-CM | POA: Diagnosis not present

## 2018-02-11 ENCOUNTER — Other Ambulatory Visit (HOSPITAL_COMMUNITY): Payer: Self-pay | Admitting: Family Medicine

## 2018-02-11 ENCOUNTER — Ambulatory Visit (HOSPITAL_COMMUNITY)
Admission: RE | Admit: 2018-02-11 | Discharge: 2018-02-11 | Disposition: A | Discharge status: Home / Self Care | Payer: Medicare HMO | Source: Ambulatory Visit | Attending: Family Medicine | Admitting: Family Medicine

## 2018-02-11 DIAGNOSIS — R5383 Other fatigue: Secondary | ICD-10-CM | POA: Diagnosis not present

## 2018-02-11 DIAGNOSIS — R6883 Chills (without fever): Secondary | ICD-10-CM

## 2018-02-11 DIAGNOSIS — R0989 Other specified symptoms and signs involving the circulatory and respiratory systems: Secondary | ICD-10-CM | POA: Diagnosis not present

## 2018-02-11 DIAGNOSIS — K5289 Other specified noninfective gastroenteritis and colitis: Secondary | ICD-10-CM | POA: Diagnosis not present

## 2018-02-11 DIAGNOSIS — R509 Fever, unspecified: Secondary | ICD-10-CM | POA: Diagnosis not present

## 2018-02-11 DIAGNOSIS — Z6826 Body mass index (BMI) 26.0-26.9, adult: Secondary | ICD-10-CM | POA: Diagnosis not present

## 2018-02-11 DIAGNOSIS — R52 Pain, unspecified: Secondary | ICD-10-CM | POA: Diagnosis not present

## 2018-02-14 ENCOUNTER — Encounter (HOSPITAL_COMMUNITY): Payer: Self-pay | Admitting: Emergency Medicine

## 2018-02-14 ENCOUNTER — Observation Stay (HOSPITAL_COMMUNITY)
Admission: EM | Admit: 2018-02-14 | Discharge: 2018-02-15 | Disposition: A | Discharge status: Home / Self Care | Payer: Medicare HMO | Attending: Internal Medicine | Admitting: Internal Medicine

## 2018-02-14 ENCOUNTER — Other Ambulatory Visit: Payer: Self-pay

## 2018-02-14 ENCOUNTER — Emergency Department (HOSPITAL_COMMUNITY): Payer: Medicare HMO

## 2018-02-14 DIAGNOSIS — R0602 Shortness of breath: Secondary | ICD-10-CM | POA: Diagnosis present

## 2018-02-14 DIAGNOSIS — J181 Lobar pneumonia, unspecified organism: Secondary | ICD-10-CM | POA: Diagnosis not present

## 2018-02-14 DIAGNOSIS — Z79899 Other long term (current) drug therapy: Secondary | ICD-10-CM | POA: Diagnosis not present

## 2018-02-14 DIAGNOSIS — J189 Pneumonia, unspecified organism: Principal | ICD-10-CM | POA: Insufficient documentation

## 2018-02-14 DIAGNOSIS — Z7984 Long term (current) use of oral hypoglycemic drugs: Secondary | ICD-10-CM | POA: Diagnosis not present

## 2018-02-14 DIAGNOSIS — E1165 Type 2 diabetes mellitus with hyperglycemia: Secondary | ICD-10-CM | POA: Insufficient documentation

## 2018-02-14 DIAGNOSIS — R079 Chest pain, unspecified: Secondary | ICD-10-CM | POA: Diagnosis not present

## 2018-02-14 DIAGNOSIS — Z7982 Long term (current) use of aspirin: Secondary | ICD-10-CM | POA: Diagnosis not present

## 2018-02-14 DIAGNOSIS — N179 Acute kidney failure, unspecified: Secondary | ICD-10-CM | POA: Diagnosis not present

## 2018-02-14 DIAGNOSIS — I10 Essential (primary) hypertension: Secondary | ICD-10-CM | POA: Diagnosis not present

## 2018-02-14 DIAGNOSIS — R05 Cough: Secondary | ICD-10-CM | POA: Diagnosis not present

## 2018-02-14 DIAGNOSIS — F17210 Nicotine dependence, cigarettes, uncomplicated: Secondary | ICD-10-CM | POA: Insufficient documentation

## 2018-02-14 DIAGNOSIS — R0902 Hypoxemia: Secondary | ICD-10-CM | POA: Diagnosis not present

## 2018-02-14 DIAGNOSIS — J9601 Acute respiratory failure with hypoxia: Secondary | ICD-10-CM | POA: Insufficient documentation

## 2018-02-14 LAB — CBG MONITORING, ED: Glucose-Capillary: 172 mg/dL — ABNORMAL HIGH (ref 70–99)

## 2018-02-14 LAB — CBC WITH DIFFERENTIAL/PLATELET
Abs Immature Granulocytes: 0.05 10*3/uL (ref 0.00–0.07)
Basophils Absolute: 0 10*3/uL (ref 0.0–0.1)
Basophils Relative: 0 %
EOS PCT: 0 %
Eosinophils Absolute: 0 10*3/uL (ref 0.0–0.5)
HCT: 41.3 % (ref 36.0–46.0)
Hemoglobin: 13.2 g/dL (ref 12.0–15.0)
Immature Granulocytes: 1 %
Lymphocytes Relative: 13 %
Lymphs Abs: 1.3 10*3/uL (ref 0.7–4.0)
MCH: 29.4 pg (ref 26.0–34.0)
MCHC: 32 g/dL (ref 30.0–36.0)
MCV: 92 fL (ref 80.0–100.0)
Monocytes Absolute: 0.8 10*3/uL (ref 0.1–1.0)
Monocytes Relative: 9 %
NRBC: 0 % (ref 0.0–0.2)
Neutro Abs: 7.4 10*3/uL (ref 1.7–7.7)
Neutrophils Relative %: 77 %
Platelets: 193 10*3/uL (ref 150–400)
RBC: 4.49 MIL/uL (ref 3.87–5.11)
RDW: 12.4 % (ref 11.5–15.5)
WBC: 9.6 10*3/uL (ref 4.0–10.5)

## 2018-02-14 LAB — LACTIC ACID, PLASMA
Lactic Acid, Venous: 1.3 mmol/L (ref 0.5–1.9)
Lactic Acid, Venous: 1.2 mmol/L (ref 0.5–1.9)

## 2018-02-14 LAB — BASIC METABOLIC PANEL
Anion gap: 11 (ref 5–15)
BUN: 17 mg/dL (ref 8–23)
CO2: 23 mmol/L (ref 22–32)
Calcium: 8.7 mg/dL — ABNORMAL LOW (ref 8.9–10.3)
Chloride: 102 mmol/L (ref 98–111)
Creatinine, Ser: 1.09 mg/dL — ABNORMAL HIGH (ref 0.44–1.00)
GFR calc non Af Amer: 53 mL/min — ABNORMAL LOW (ref >60)
Glucose, Bld: 206 mg/dL — ABNORMAL HIGH (ref 70–99)
Potassium: 4 mmol/L (ref 3.5–5.1)
Sodium: 136 mmol/L (ref 135–145)

## 2018-02-14 LAB — BRAIN NATRIURETIC PEPTIDE: B NATRIURETIC PEPTIDE 5: 94 pg/mL (ref 0.0–100.0)

## 2018-02-14 LAB — TROPONIN I: Troponin I: <0.03 ng/mL (ref <0.03)

## 2018-02-14 LAB — INFLUENZA PANEL BY PCR (TYPE A & B)
Influenza A By PCR: NEGATIVE
Influenza B By PCR: NEGATIVE

## 2018-02-14 MED ORDER — INSULIN ASPART 100 UNIT/ML ~~LOC~~ SOLN: 0.0000 [IU] | Freq: Every day | SUBCUTANEOUS | Status: DC

## 2018-02-14 MED ORDER — SODIUM CHLORIDE 0.9 % IV BOLUS
1000.0000 mL | Freq: Once | INTRAVENOUS | Status: AC
  Administered 2018-02-14: 1000 mL via INTRAVENOUS

## 2018-02-14 MED ORDER — HYDROCODONE-ACETAMINOPHEN 5-325 MG PO TABS
1.0000 | ORAL_TABLET | ORAL | Status: DC | PRN
  Administered 2018-02-15: 1 via ORAL
  Filled 2018-02-14: qty 1

## 2018-02-14 MED ORDER — AMLODIPINE BESYLATE 5 MG PO TABS
5.0000 mg | ORAL_TABLET | Freq: Every day | ORAL | Status: DC
  Administered 2018-02-15: 5 mg via ORAL
  Filled 2018-02-14: qty 1

## 2018-02-14 MED ORDER — SODIUM CHLORIDE 0.9 % IV SOLN
1.0000 g | INTRAVENOUS | Status: DC
  Filled 2018-02-14: qty 10

## 2018-02-14 MED ORDER — POLYETHYLENE GLYCOL 3350 17 G PO PACK: 17.0000 g | PACK | Freq: Every day | ORAL | Status: DC | PRN

## 2018-02-14 MED ORDER — ONDANSETRON HCL 4 MG/2ML IJ SOLN: 4.0000 mg | Freq: Four times a day (QID) | INTRAMUSCULAR | Status: DC | PRN

## 2018-02-14 MED ORDER — DOXYCYCLINE HYCLATE 100 MG PO TABS
100.0000 mg | ORAL_TABLET | Freq: Two times a day (BID) | ORAL | Status: DC
  Administered 2018-02-14 – 2018-02-15 (×2): 100 mg via ORAL
  Filled 2018-02-14 (×2): qty 1

## 2018-02-14 MED ORDER — SODIUM CHLORIDE 0.9% FLUSH: 3.0000 mL | INTRAVENOUS | Status: DC | PRN

## 2018-02-14 MED ORDER — ACETAMINOPHEN 325 MG PO TABS: 650.0000 mg | ORAL_TABLET | Freq: Four times a day (QID) | ORAL | Status: DC | PRN

## 2018-02-14 MED ORDER — SODIUM CHLORIDE 0.9 % IV SOLN
500.0000 mg | Freq: Once | INTRAVENOUS | Status: AC
  Administered 2018-02-14: 500 mg via INTRAVENOUS
  Filled 2018-02-14: qty 500

## 2018-02-14 MED ORDER — SODIUM CHLORIDE 0.9% FLUSH
3.0000 mL | Freq: Two times a day (BID) | INTRAVENOUS | Status: DC
  Administered 2018-02-15: 3 mL via INTRAVENOUS

## 2018-02-14 MED ORDER — SODIUM CHLORIDE 0.9 % IV SOLN: 250.0000 mL | INTRAVENOUS | Status: DC | PRN

## 2018-02-14 MED ORDER — ENOXAPARIN SODIUM 40 MG/0.4ML ~~LOC~~ SOLN
40.0000 mg | SUBCUTANEOUS | Status: DC
  Administered 2018-02-14: 40 mg via SUBCUTANEOUS
  Filled 2018-02-14: qty 0.4

## 2018-02-14 MED ORDER — PRAVASTATIN SODIUM 10 MG PO TABS
20.0000 mg | ORAL_TABLET | Freq: Every day | ORAL | Status: DC
  Filled 2018-02-14: qty 1

## 2018-02-14 MED ORDER — SODIUM CHLORIDE 0.9 % IV SOLN
1.0000 g | Freq: Once | INTRAVENOUS | Status: AC
  Administered 2018-02-14: 1 g via INTRAVENOUS
  Filled 2018-02-14: qty 10

## 2018-02-14 MED ORDER — ASPIRIN EC 81 MG PO TBEC
81.0000 mg | DELAYED_RELEASE_TABLET | Freq: Every day | ORAL | Status: DC
  Administered 2018-02-15: 81 mg via ORAL
  Filled 2018-02-14: qty 1

## 2018-02-14 MED ORDER — INSULIN ASPART 100 UNIT/ML ~~LOC~~ SOLN
0.0000 [IU] | Freq: Three times a day (TID) | SUBCUTANEOUS | Status: DC
  Administered 2018-02-15 (×2): 2 [IU] via SUBCUTANEOUS

## 2018-02-14 MED ORDER — ACETAMINOPHEN 650 MG RE SUPP: 650.0000 mg | Freq: Four times a day (QID) | RECTAL | Status: DC | PRN

## 2018-02-14 MED ORDER — ONDANSETRON HCL 4 MG PO TABS: 4.0000 mg | ORAL_TABLET | Freq: Four times a day (QID) | ORAL | Status: DC | PRN

## 2018-02-14 NOTE — ED Notes (Signed)
Pt o2 sat at 85% on RA while ambulating to restroom. RN aware.

## 2018-02-14 NOTE — ED Provider Notes (Signed)
[facility] EMERGENCY DEPARTMENT Provider Note   CSN: [id number] Arrival date & time: 02/14/18  1506     History   Chief Complaint Chief Complaint  Patient presents with  . Shortness of Breath    HPI Sandra Mitchell is a 68 y.o. female.  She has history of diabetes and hypertension.  She said she started with a cough about 5 days ago and saw her PCP who ordered her chest x-ray and started her on a Z-Pak and told her she had pneumonia.  She has 2 days of antibiotics left but she does not feel any better.  She said she has had some fevers and chills but has not checked her temperature.  She is coughing intermittently and is producing up some gray sputum.  She has been drinking plenty but decreased appetite.  She said she had a flu shot this year and does not smoke.  Does not use oxygen.  She has a little chest discomfort with her cough but it sounds more muscular.  No leg swelling.  The history is provided by the patient.  Shortness of Breath  Severity:  Moderate Onset quality:  Gradual Timing:  Constant Progression:  Unchanged Chronicity:  New Context: activity   Relieved by:  Nothing Worsened by:  Activity and coughing Ineffective treatments:  Rest Associated symptoms: chest pain, cough and sputum production   Associated symptoms: no abdominal pain, no diaphoresis, no fever, no headaches, no hemoptysis, no neck pain, no rash, no sore throat and no syncope   Risk factors: no hx of PE/DVT and no tobacco use     Past Medical History:  Diagnosis Date  . Diabetes mellitus without complication (HCC)   . Hypercholesteremia   . Hypertension     Patient Active Problem List   Diagnosis Date Noted  . Uncontrolled type 2 diabetes mellitus with hyperglycemia (HCC) 11/15/2016  . SHOULDER PAIN, RIGHT 08/03/2008  . DIABETES MELLITUS, WITH NEUROLOGICAL COMPLICATIONS 11/14/2006  . CARPAL TUNNEL SYNDROME, BILATERAL 11/14/2006  . Current smoker 05/21/2006  . Mixed hyperlipidemia  01/07/2006  . Essential hypertension, benign 01/07/2006  . ALLERGIC RHINITIS 01/07/2006  . GERD 01/07/2006  . OSTEOARTHRITIS 01/07/2006    Past Surgical History:  Procedure Laterality Date  . ABDOMINAL HYSTERECTOMY    . COLONOSCOPY N/A 12/14/2013   Procedure: COLONOSCOPY;  Surgeon: Dalia Heading, MD;  Location: [facility];  Service: Gastroenterology;  Laterality: N/A;  . EXPLORATORY LAPAROTOMY    . Left knee arthroscopy       OB History   No obstetric history on file.      Home Medications    Prior to Admission medications   Medication Sig Start Date End Date Taking? Authorizing Provider  amLODipine (NORVASC) 5 MG tablet Take 5 mg by mouth daily.    [provider]  aspirin EC 81 MG tablet Take 81 mg by mouth daily.    [provider]  Cholecalciferol (VITAMIN D-3) 1000 UNITS CAPS Take 1 capsule by mouth daily.    [provider]  glucose blood test strip 1 each by Other route 2 (two) times daily. Use as instructed 4 x daily. E11.65 Bayer Contour test strips 05/27/17   Nida, Denman George, MD  losartan (COZAAR) 100 MG tablet Take 100 mg by mouth daily.    [provider]  metFORMIN (GLUCOPHAGE) 1000 MG tablet Take 500 mg by mouth 2 (two) times daily with a meal.    [provider]  Multiple Vitamins-Minerals (BIOTECT PLUS  PO) Take by mouth.    [provider]  Omega 3 1000 MG CAPS Take by mouth.    [provider]  pravastatin (PRAVACHOL) 20 MG tablet Take 20 mg by mouth daily.    [provider]    Family History History reviewed. No pertinent family history.  Social History Social History   Tobacco Use  . Smoking status: Current Every Day Smoker    Packs/day: 0.50    Years: 20.00    Pack years: 10.00    Types: Cigarettes  . Smokeless tobacco: Never Used  Substance Use Topics  . Alcohol use: No  . Drug use: No     Allergies   Ace inhibitors and Ampicillin   Review of  Systems Review of Systems  Constitutional: Positive for appetite change. Negative for diaphoresis and fever.  HENT: Negative for sore throat.   Eyes: Negative for visual disturbance.  Respiratory: Positive for cough, sputum production and shortness of breath. Negative for hemoptysis.   Cardiovascular: Positive for chest pain. Negative for syncope.  Gastrointestinal: Negative for abdominal pain.  Genitourinary: Negative for dysuria.  Musculoskeletal: Negative for neck pain.  Skin: Negative for rash.  Neurological: Negative for headaches.     Physical Exam Updated Vital Signs BP 133/69 (BP Location: Left Arm)   Pulse 78   Temp 98 F (36.7 C) (Oral)   Resp (!) 21   Ht 6\' 1"  (1.854 m)   Wt 81.6 kg   SpO2 (!) 88%   BMI 23.75 kg/m   Physical Exam Vitals signs and nursing note reviewed.  Constitutional:      General: She is not in acute distress.    Appearance: She is well-developed.  HENT:     Head: Normocephalic and atraumatic.  Eyes:     Conjunctiva/sclera: Conjunctivae normal.  Neck:     Musculoskeletal: Neck supple.  Cardiovascular:     Rate and Rhythm: Normal rate and regular rhythm.     Heart sounds: No murmur.  Pulmonary:     Effort: Pulmonary effort is normal. No respiratory distress.     Breath sounds: Examination of the right-upper field reveals rhonchi. Examination of the left-upper field reveals rhonchi. Examination of the right-middle field reveals rhonchi. Examination of the left-middle field reveals rhonchi. Examination of the right-lower field reveals rhonchi. Examination of the left-lower field reveals rhonchi. Rhonchi present.  Abdominal:     Palpations: Abdomen is soft.     Tenderness: There is no abdominal tenderness.  Musculoskeletal: Normal range of motion.        General: No swelling or tenderness.     Right lower leg: She exhibits no tenderness. No edema.     Left lower leg: She exhibits no tenderness. No edema.  Skin:    General: Skin is warm  and dry.     Capillary Refill: Capillary refill takes less than 2 seconds.  Neurological:     General: No focal deficit present.     Mental Status: She is alert and oriented to person, place, and time.      ED Treatments / Results  Labs (all labs ordered are listed, but only abnormal results are displayed) Labs Reviewed  BASIC METABOLIC PANEL - Abnormal; Notable for the following components:      Result Value   Glucose, Bld 206 (*)    Creatinine, Ser 1.09 (*)    Calcium 8.7 (*)    GFR calc non Af Amer 53 (*)    All other components within  normal limits  CULTURE, BLOOD (ROUTINE X 2)  CULTURE, BLOOD (ROUTINE X 2)  EXPECTORATED SPUTUM ASSESSMENT W REFEX TO RESP CULTURE  GRAM STAIN  LACTIC ACID, PLASMA  LACTIC ACID, PLASMA  CBC WITH DIFFERENTIAL/PLATELET  TROPONIN I  BRAIN NATRIURETIC PEPTIDE  INFLUENZA PANEL BY PCR (TYPE A & B)  HIV ANTIBODY (ROUTINE TESTING W REFLEX)  STREP PNEUMONIAE URINARY ANTIGEN  BASIC METABOLIC PANEL  CBC WITH DIFFERENTIAL/PLATELET    EKG EKG Interpretation  Date/Time:  Saturday February 14 2018 15:25:11 EST Ventricular Rate:  77 PR Interval:    QRS Duration: 96 QT Interval:  383 QTC Calculation: 434 R Axis:   48 Text Interpretation:  Sinus arrhythmia Ventricular premature complex Low voltage, precordial leads Probable anteroseptal infarct, old Baseline wander in lead(s) V3 no prior to compare with Confirmed by Meridee ScoreButler, Michael [id number]) on 02/14/2018 3:39:16 PM   Radiology Dg Chest 2 View  Result Date: 02/14/2018 CLINICAL DATA:  Cough. Hypoxia. Recently diagnosed pneumonia. Smoker. EXAM: CHEST - 2 VIEW COMPARISON:  02/11/2018. FINDINGS: Normal sized heart. No significant change in patchy opacity at the left lung base. Stable mild diffuse peribronchial thickening and accentuation of the interstitial markings. Mild thoracic spine degenerative changes. IMPRESSION: 1. Stable left basilar pneumonia. 2. Stable mild chronic bronchitic changes and chronic  interstitial lung disease. Electronically Signed   By: Beckie SaltsSteven  Reid M.D.   On: 02/14/2018 17:08    Procedures Procedures (including critical care time)  Medications Ordered in ED Medications  aspirin EC tablet 81 mg (has no administration in time range)  amLODipine (NORVASC) tablet 5 mg (has no administration in time range)  pravastatin (PRAVACHOL) tablet 20 mg (has no administration in time range)  enoxaparin (LOVENOX) injection 40 mg (40 mg Subcutaneous Given 02/14/18 2051)  acetaminophen (TYLENOL) tablet 650 mg (has no administration in time range)    Or  acetaminophen (TYLENOL) suppository 650 mg (has no administration in time range)  HYDROcodone-acetaminophen (NORCO/VICODIN) 5-325 MG per tablet 1-2 tablet (has no administration in time range)  sodium chloride flush (NS) 0.9 % injection 3 mL (has no administration in time range)  sodium chloride flush (NS) 0.9 % injection 3 mL (has no administration in time range)  0.9 %  sodium chloride infusion (has no administration in time range)  polyethylene glycol (MIRALAX / GLYCOLAX) packet 17 g (has no administration in time range)  ondansetron (ZOFRAN) tablet 4 mg (has no administration in time range)    Or  ondansetron (ZOFRAN) injection 4 mg (has no administration in time range)  cefTRIAXone (ROCEPHIN) 1 g in sodium chloride 0.9 % 100 mL IVPB (has no administration in time range)  doxycycline (VIBRA-TABS) tablet 100 mg (has no administration in time range)  insulin aspart (novoLOG) injection 0-9 Units (has no administration in time range)  insulin aspart (novoLOG) injection 0-5 Units (has no administration in time range)  cefTRIAXone (ROCEPHIN) 1 g in sodium chloride 0.9 % 100 mL IVPB ( Intravenous Stopped 02/14/18 1841)  azithromycin (ZITHROMAX) 500 mg in sodium chloride 0.9 % 250 mL IVPB ( Intravenous Stopped 02/14/18 1906)  sodium chloride 0.9 % bolus 1,000 mL (0 mLs Intravenous Stopped 02/14/18 1908)     Initial Impression /  Assessment and Plan / ED Course  I have reviewed the triage vital signs and the nursing notes.  Pertinent labs & imaging results that were available during my care of the patient were reviewed by me and considered in my medical decision making (see chart for details).  Clinical Course as  of Feb 14 2057  Sat Feb 14, 2018  1908 Trended patient off oxygen she dropped to 84%.  Will page the hospitalist for admission.   [MB]  2023 Discussed with Dr. Antionette Char from [facility] hospitalist service who will evaluate the patient for admission.   [MB]    Clinical Course User Index [MB] Terrilee Files, MD    Final Clinical Impressions(s) / ED Diagnoses   Final diagnoses:  Community acquired pneumonia of left lower lobe of lung [facility])  Hypoxia    ED Discharge Orders    None       Terrilee Files, MD 02/14/18 2100

## 2018-02-14 NOTE — H&P (Signed)
History and Physical    Sandra Mitchell BJS:[id number] DOB: 01-19-1951 DOA: 02/14/2018  PCP: Assunta Found, MD   Patient coming from: Home   Chief Complaint: Productive cough, SOB   HPI: Sandra Mitchell is a 68 y.o. female with medical history significant for type 2 diabetes mellitus and hypertension, now presenting to the emergency department for evaluation of shortness of breath and productive cough.  Patient reports that she developed rhinorrhea, chills, cough, and shortness of breath approximately 1 week ago, was evaluated by her PCP and started on azithromycin for suspected pneumonia, but remains dyspneic even at rest.  She reports that the rhinorrhea has resolved, she continues to have occasional chills, continues to have cough productive of thick grayish sputum, and is short of breath with any exertion, and even at rest now.  She denies any chest pain, leg swelling, or leg tenderness.  Denies any history of asthma, COPD, or other cardiopulmonary disease.  ED Course: Upon arrival to the ED, patient is found to be afebrile, saturating 88% on room air while at rest, tachypneic, and with stable blood pressure.  EKG features a sinus arrhythmia with PVC.  Chest x-ray is notable for left basilar pneumonia, mild bronchitic changes, and chronic interstitial lung disease.  Chemistry panel is notable for a glucose of 206 and creatinine 1.09.  Influenza PCR is negative.  CBC is unremarkable.  Lactic acid is reassuringly normal.  Troponin is undetectable and BNP is normal.  Blood cultures were collected in the ED, 1 L of normal saline was administered, and the patient was treated with empiric Rocephin and azithromycin.  O2 saturation decreased to the low to mid 80s with ambulation and she was placed on supplemental oxygen.  She will be observed for further evaluation and management of CAP with acute hypoxic respiratory failure.  Review of Systems:  All other systems reviewed and apart from HPI, are  negative.  Past Medical History:  Diagnosis Date  . Diabetes mellitus without complication (HCC)   . Hypercholesteremia   . Hypertension     Past Surgical History:  Procedure Laterality Date  . ABDOMINAL HYSTERECTOMY    . COLONOSCOPY N/A 12/14/2013   Procedure: COLONOSCOPY;  Surgeon: Dalia Heading, MD;  Location: [facility];  Service: Gastroenterology;  Laterality: N/A;  . EXPLORATORY LAPAROTOMY    . Left knee arthroscopy       reports that she has been smoking cigarettes. She has a 10.00 pack-year smoking history. She has never used smokeless tobacco. She reports that she does not drink alcohol or use drugs.  Allergies  Allergen Reactions  . Ace Inhibitors     REACTION: cough  . Ampicillin     Family History  Problem Relation Age of Onset  . Hypertension Other      Prior to Admission medications   Medication Sig Start Date End Date Taking? Authorizing Provider  amLODipine (NORVASC) 5 MG tablet Take 5 mg by mouth daily.    [provider]  aspirin EC 81 MG tablet Take 81 mg by mouth daily.    [provider]  Cholecalciferol (VITAMIN D-3) 1000 UNITS CAPS Take 1 capsule by mouth daily.    [provider]  glucose blood test strip 1 each by Other route 2 (two) times daily. Use as instructed 4 x daily. E11.65 Bayer Contour test strips 05/27/17   Nida, Denman George, MD  losartan (COZAAR) 100 MG tablet Take 100 mg by mouth daily.    [provider]  metFORMIN (GLUCOPHAGE) 1000 MG tablet Take 500 mg by mouth 2 (two) times daily with a meal.    [provider]  Multiple Vitamins-Minerals (BIOTECT PLUS PO) Take by mouth.    [provider]  Omega 3 1000 MG CAPS Take by mouth.    [provider]  pravastatin (PRAVACHOL) 20 MG tablet Take 20 mg by mouth daily.    [provider]    Physical Exam: Vitals:   02/14/18 1645 02/14/18 1700 02/14/18 1930 02/14/18 2030  BP:  (!) 127/97 (!) 127/91 134/80    Pulse: 79 75 79 79  Resp: (!) 29 (!) 34 (!) 37 (!) 23  Temp:      TempSrc:      SpO2: (!) 88% 90% 91% 92%  Weight:      Height:         Constitutional: NAD, calm  Eyes: PERTLA, lids and conjunctivae normal ENMT: Mucous membranes are moist. Posterior pharynx clear of any exudate or lesions.   Neck: normal, supple, no masses, no thyromegaly Respiratory: Mild tachypnea, scattered rhonchi, no wheezing. No accessory muscle use.  Cardiovascular: S1 & S2 heard, regular rate and rhythm. No extremity edema.   Abdomen: No distension, no tenderness, soft. Bowel sounds active.  Musculoskeletal: no clubbing / cyanosis. No joint deformity upper and lower extremities.    Skin: no significant rashes, lesions, ulcers. Warm, dry, well-perfused. Neurologic: No facial asymmetry. Sensation intact. Moving all extremities.  Psychiatric:  Alert and oriented x 3. Pleasant and cooperative.    Labs on Admission: I have personally reviewed following labs and imaging studies  CBC: Recent Labs  Lab 02/14/18 1548  WBC 9.6  NEUTROABS 7.4  HGB 13.2  HCT 41.3  MCV 92.0  PLT 193   Basic Metabolic Panel: Recent Labs  Lab 02/14/18 1548  NA 136  K 4.0  CL 102  CO2 23  GLUCOSE 206*  BUN 17  CREATININE 1.09*  CALCIUM 8.7*   GFR: Estimated Creatinine Clearance: 60.4 mL/min (A) (by C-G formula based on SCr of 1.09 mg/dL (H)). Liver Function Tests: No results for input(s): AST, ALT, ALKPHOS, BILITOT, PROT, ALBUMIN in the last 168 hours. No results for input(s): LIPASE, AMYLASE in the last 168 hours. No results for input(s): AMMONIA in the last 168 hours. Coagulation Profile: No results for input(s): INR, PROTIME in the last 168 hours. Cardiac Enzymes: Recent Labs  Lab 02/14/18 1548  TROPONINI <0.03   BNP (last 3 results) No results for input(s): PROBNP in the last 8760 hours. HbA1C: No results for input(s): HGBA1C in the last 72 hours. CBG: No results for input(s): GLUCAP in the last 168  hours. Lipid Profile: No results for input(s): CHOL, HDL, LDLCALC, TRIG, CHOLHDL, LDLDIRECT in the last 72 hours. Thyroid Function Tests: No results for input(s): TSH, T4TOTAL, FREET4, T3FREE, THYROIDAB in the last 72 hours. Anemia Panel: No results for input(s): VITAMINB12, FOLATE, FERRITIN, TIBC, IRON, RETICCTPCT in the last 72 hours. Urine analysis: No results found for: COLORURINE, APPEARANCEUR, LABSPEC, PHURINE, GLUCOSEU, HGBUR, BILIRUBINUR, KETONESUR, PROTEINUR, UROBILINOGEN, NITRITE, LEUKOCYTESUR Sepsis Labs: @LABRCNTIP (procalcitonin:4,lacticidven:4) ) Recent Results (from the past 240 hour(s))  Culture, blood (routine x 2)     Status: None (Preliminary result)   Collection Time: 02/14/18  3:47 PM  Result Value Ref Range Status   Specimen Description RIGHT ANTECUBITAL  Final   Special Requests   Final    BOTTLES DRAWN AEROBIC AND ANAEROBIC Blood Culture results may not be optimal due to an inadequate volume  of blood received in culture bottles Performed at [facility], [address] [place], [place] [address]    Culture PENDING  Incomplete   Report Status PENDING  Incomplete  Culture, blood (routine x 2)     Status: None (Preliminary result)   Collection Time: 02/14/18  3:49 PM  Result Value Ref Range Status   Specimen Description LEFT ANTECUBITAL  Final   Special Requests   Final    BOTTLES DRAWN AEROBIC AND ANAEROBIC Blood Culture adequate volume Performed at [facility], [address] [place], [place] [address]    Culture PENDING  Incomplete   Report Status PENDING  Incomplete     Radiological Exams on Admission: Dg Chest 2 View  Result Date: 02/14/2018 CLINICAL DATA:  Cough. Hypoxia. Recently diagnosed pneumonia. Smoker. EXAM: CHEST - 2 VIEW COMPARISON:  02/11/2018. FINDINGS: Normal sized heart. No significant change in patchy opacity at the left lung base. Stable mild diffuse peribronchial thickening and accentuation of the interstitial markings. Mild thoracic  spine degenerative changes. IMPRESSION: 1. Stable left basilar pneumonia. 2. Stable mild chronic bronchitic changes and chronic interstitial lung disease. Electronically Signed   By: Beckie SaltsSteven  Reid M.D.   On: 02/14/2018 17:08    EKG: Independently reviewed. Sinus arrhythmia.   Assessment/Plan   1. Pneumonia; acute hypoxic respiratory failure   - Reports recent PNA diagnosis, now presenting with persistent SOB and productive cough despite azithromycin  - Found to be saturating upper 80's on rm air at rest and low-mid 80's with ambulation  - Blood cultures were collected and she was treated with Rocephin, azithromycin, and supplemental O2 in ED  - Check sputum culture and strep pneumo antigen, continue empiric antibiotics with Rocephin and doxycycline, continue supplemental O2 as needed   2. Hypertension  - BP at goal  - Continue Norvasc, hold losartan initially given elevated creatinine    3. Type II DM  - A1c was 7.3% in April 2019  - Managed with metformin at home, held on admission  - Check CBG's and use a SSI with Novolog while in the hospital    DVT prophylaxis: Lovenox  Code Status: Full   Family Communication: Discussed with patient  Consults called: None Admission status: Observation     Briscoe Deutscherimothy S Jahkari Maclin, MD [facility] Pager [phone]  If 7PM-7AM, please contact night-coverage [email or web address] Password [email or web address]  02/14/2018, 8:43 PM

## 2018-02-14 NOTE — ED Triage Notes (Signed)
Pt was recently dx with pneumonia and states she has 2 antibx left to take, but denies improvement. Gray sputum production noted, increased SOB.

## 2018-02-14 NOTE — ED Notes (Signed)
Pt ambulated to the restroom on RA and dropped to 84% with increased WOB and dyspnea.

## 2018-02-15 DIAGNOSIS — J181 Lobar pneumonia, unspecified organism: Secondary | ICD-10-CM | POA: Diagnosis not present

## 2018-02-15 DIAGNOSIS — E1165 Type 2 diabetes mellitus with hyperglycemia: Secondary | ICD-10-CM | POA: Diagnosis not present

## 2018-02-15 DIAGNOSIS — N179 Acute kidney failure, unspecified: Secondary | ICD-10-CM

## 2018-02-15 DIAGNOSIS — I10 Essential (primary) hypertension: Secondary | ICD-10-CM | POA: Diagnosis not present

## 2018-02-15 LAB — CBC WITH DIFFERENTIAL/PLATELET
Abs Immature Granulocytes: 0.07 10*3/uL (ref 0.00–0.07)
Basophils Absolute: 0 10*3/uL (ref 0.0–0.1)
Basophils Relative: 0 %
Eosinophils Absolute: 0 10*3/uL (ref 0.0–0.5)
Eosinophils Relative: 0 %
HCT: 38.7 % (ref 36.0–46.0)
Hemoglobin: 12.4 g/dL (ref 12.0–15.0)
Immature Granulocytes: 1 %
Lymphocytes Relative: 18 %
Lymphs Abs: 1.6 10*3/uL (ref 0.7–4.0)
MCH: 29.6 pg (ref 26.0–34.0)
MCHC: 32 g/dL (ref 30.0–36.0)
MCV: 92.4 fL (ref 80.0–100.0)
MONO ABS: 0.9 10*3/uL (ref 0.1–1.0)
Monocytes Relative: 10 %
Neutro Abs: 6.1 10*3/uL (ref 1.7–7.7)
Neutrophils Relative %: 71 %
PLATELETS: 215 10*3/uL (ref 150–400)
RBC: 4.19 MIL/uL (ref 3.87–5.11)
RDW: 12.4 % (ref 11.5–15.5)
WBC: 8.6 10*3/uL (ref 4.0–10.5)
nRBC: 0 % (ref 0.0–0.2)

## 2018-02-15 LAB — BASIC METABOLIC PANEL
Anion gap: 8 (ref 5–15)
BUN: 12 mg/dL (ref 8–23)
CO2: 24 mmol/L (ref 22–32)
Calcium: 8.3 mg/dL — ABNORMAL LOW (ref 8.9–10.3)
Chloride: 104 mmol/L (ref 98–111)
Creatinine, Ser: 0.91 mg/dL (ref 0.44–1.00)
GFR calc Af Amer: >60 mL/min (ref >60)
Glucose, Bld: 166 mg/dL — ABNORMAL HIGH (ref 70–99)
Potassium: 4.1 mmol/L (ref 3.5–5.1)
Sodium: 136 mmol/L (ref 135–145)

## 2018-02-15 LAB — GLUCOSE, CAPILLARY: Glucose-Capillary: 178 mg/dL — ABNORMAL HIGH (ref 70–99)

## 2018-02-15 LAB — STREP PNEUMONIAE URINARY ANTIGEN: Strep Pneumo Urinary Antigen: NEGATIVE

## 2018-02-15 MED ORDER — LORATADINE 10 MG PO TABS: 10.0000 mg | ORAL_TABLET | Freq: Every day | ORAL | 1 refills | Status: AC | PRN

## 2018-02-15 MED ORDER — BENZONATATE 200 MG PO CAPS: 200.0000 mg | ORAL_CAPSULE | Freq: Three times a day (TID) | ORAL | 0 refills | Status: AC | PRN

## 2018-02-15 MED ORDER — DOXYCYCLINE HYCLATE 100 MG PO TABS: 100.0000 mg | ORAL_TABLET | Freq: Two times a day (BID) | ORAL | 0 refills | Status: AC

## 2018-02-15 MED ORDER — BENZONATATE 100 MG PO CAPS
200.0000 mg | ORAL_CAPSULE | Freq: Three times a day (TID) | ORAL | Status: DC | PRN
  Administered 2018-02-15: 200 mg via ORAL
  Filled 2018-02-15: qty 2

## 2018-02-15 NOTE — Progress Notes (Signed)
SATURATION QUALIFICATIONS:   Patient Saturations on Room Air at Rest = 94%  Patient Saturations on Room Air while Ambulating = 90%

## 2018-02-15 NOTE — Progress Notes (Signed)
Patient off O2 for about 1.5 hours. Current O2 saturation 92%. Will continue to monitor on RA.

## 2018-02-15 NOTE — Discharge Summary (Signed)
Physician Discharge Summary  Sandra Mitchell [id number]:[id number] DOB: 1950-07-15 DOA: 02/14/2018  PCP: Assunta Found, MD  Admit date: 02/14/2018 Discharge date: 02/15/2018  Time spent: 30 minutes  Recommendations for Outpatient Follow-up:  1. Repeat basic metabolic panel to follow-up lites and renal function 2. Reassess blood pressure and further adjust antihypertensive regimen as needed 3. Repeat chest x-ray in 4-6 weeks to assure complete resolution of infiltrate.   Discharge Diagnoses:  Principal Problem:   CAP (community acquired pneumonia) Active Problems:   Essential hypertension, benign   Uncontrolled type 2 diabetes mellitus with hyperglycemia (HCC)   Acute respiratory failure with hypoxia (HCC)   AKI (acute kidney injury) (HCC)   Discharge Condition: Stable and improved.  Patient discharged home with instruction to follow-up with PCP in 2 weeks.  Diet recommendation: Heart healthy and modified carbohydrate diet.  Filed Weights   02/14/18 1518 02/15/18 0824  Weight: 81.6 kg 84.8 kg    History of present illness:  As per H&P written by Dr. Odie Sera on 02/14/2018  68 y.o. female with medical history significant for type 2 diabetes mellitus and hypertension, now presenting to the emergency department for evaluation of shortness of breath and productive cough.  Patient reports that she developed rhinorrhea, chills, cough, and shortness of breath approximately 1 week ago, was evaluated by her PCP and started on azithromycin for suspected pneumonia, but remains dyspneic even at rest.  She reports that the rhinorrhea has resolved, she continues to have occasional chills, continues to have cough productive of thick grayish sputum, and is short of breath with any exertion, and even at rest now.  She denies any chest pain, leg swelling, or leg tenderness.  Denies any history of asthma, COPD, or other cardiopulmonary disease.  Hospital Course:  1-community-acquired pneumonia with mild  hypoxic respiratory failure -Patient received fluid resuscitation, supportive care, nebulizer treatment and IV antibiotics -Patient symptoms overall better at discharge oxygen saturation above 90% at rest and exertion. -Patient has been discharged on doxycycline to be taken twice a day to complete 7 days treatment -Continue the use of Flonase and Claritin -Also discharged on Tessalon Perles to assist with coughing spells.  2-hypertension -Blood pressure at goal -Continue the use of Norvasc and resume the use of losartan -Heart healthy diet advised.  3-type 2 diabetes mellitus -A1c 7.3 in April 2019 -Continue the use of home oral hypoglycemic agents and follow-up with PCP as an outpatient for further management of her diabetes.  4-acute kidney injury -In the setting of prerenal acidemia and dehydration -Resolved with fluid resuscitation after holding ARB for 24 hours -Please repeat basic metabolic panel at follow-up visit to reassess electrolytes and renal function trend.  Procedures:  See below for x-ray reports  Consultations:  None  Discharge Exam: Vitals:   02/15/18 0824 02/15/18 1040  BP: 138/76   Pulse: 67   Resp: 20   Temp: 97.6 F (36.4 C)   SpO2: 95% 92%    General: Afebrile, reports some pleuritic chest pain from coughing spells; no nausea, no vomiting, good oxygen saturation on room air. Cardiovascular: S1 and S2, no rubs, no gallops, no murmurs. Respiratory: Good air movement bilaterally, positive rhonchi (left more than right); no wheezing; normal respiratory effort. Abdomen: Soft, nontender, nondistended, positive bowel sounds. Extremities: No edema, no cyanosis, no clubbing.  Discharge Instructions   Discharge Instructions    Diet - low sodium heart healthy   Complete by:  As directed    Discharge instructions   Complete by:  As directed    Keep yourself well-hydrated Take medications as prescribed Arrange follow-up with PCP in 2 weeks      Allergies as of 02/15/2018      Reactions   Ace Inhibitors    REACTION: cough   Ampicillin       Medication List    STOP taking these medications   azithromycin 250 MG tablet Commonly known as:  ZITHROMAX     TAKE these medications   amLODipine 5 MG tablet Commonly known as:  NORVASC Take 5 mg by mouth daily.   aspirin EC 81 MG tablet Take 81 mg by mouth daily.   benzonatate 200 MG capsule Commonly known as:  TESSALON Take 1 capsule (200 mg total) by mouth 3 (three) times daily as needed for cough.   BIOTECT PLUS PO Take by mouth.   doxycycline 100 MG tablet Commonly known as:  VIBRA-TABS Take 1 tablet (100 mg total) by mouth 2 (two) times daily for 7 days.   fluticasone 50 MCG/ACT nasal spray Commonly known as:  FLONASE Place 2 sprays into both nostrils daily.   glucose blood test strip 1 each by Other route 2 (two) times daily. Use as instructed 4 x daily. E11.65 Bayer Contour test strips   loratadine 10 MG tablet Commonly known as:  CLARITIN Take 1 tablet (10 mg total) by mouth daily as needed for allergies.   losartan 100 MG tablet Commonly known as:  COZAAR Take 100 mg by mouth daily.   metFORMIN 1000 MG tablet Commonly known as:  GLUCOPHAGE Take 500 mg by mouth 2 (two) times daily with a meal.   Omega 3 1000 MG Caps Take by mouth.   pravastatin 20 MG tablet Commonly known as:  PRAVACHOL Take 20 mg by mouth daily.   Vitamin D-3 25 MCG (1000 UT) Caps Take 1 capsule by mouth daily.      Allergies  Allergen Reactions  . Ace Inhibitors     REACTION: cough  . Ampicillin    Follow-up Information    Assunta FoundGolding, John, MD. Schedule an appointment as soon as possible for a visit in 2 week(s).   Specialty:  Family Medicine Contact information: [address] [place] [address] [phone]           The results of significant diagnostics from this hospitalization (including imaging, microbiology, ancillary and laboratory) are  listed below for reference.    Significant Diagnostic Studies: Dg Chest 2 View  Result Date: 02/14/2018 CLINICAL DATA:  Cough. Hypoxia. Recently diagnosed pneumonia. Smoker. EXAM: CHEST - 2 VIEW COMPARISON:  02/11/2018. FINDINGS: Normal sized heart. No significant change in patchy opacity at the left lung base. Stable mild diffuse peribronchial thickening and accentuation of the interstitial markings. Mild thoracic spine degenerative changes. IMPRESSION: 1. Stable left basilar pneumonia. 2. Stable mild chronic bronchitic changes and chronic interstitial lung disease. Electronically Signed   By: Beckie SaltsSteven  Reid M.D.   On: 02/14/2018 17:08   Dg Chest 2 View  Result Date: 02/11/2018 CLINICAL DATA:  Chills. Lower left lung crackles on exam. EXAM: CHEST - 2 VIEW COMPARISON:  05/10/2011 chest radiograph. FINDINGS: Stable cardiomediastinal silhouette with normal heart size. No pneumothorax. No pleural effusion. Patchy left lung base consolidation is new. No pulmonary edema. IMPRESSION: Patchy left lung base consolidation, suspicious for pneumonia. Recommend follow-up PA and lateral post treatment chest radiographs in 4-6 weeks. Electronically Signed   By: Delbert PhenixJason A Poff M.D.   On: 02/11/2018 17:25    Microbiology: Recent Results (from  the past 240 hour(s))  Culture, blood (routine x 2)     Status: None (Preliminary result)   Collection Time: 02/14/18  3:47 PM  Result Value Ref Range Status   Specimen Description RIGHT ANTECUBITAL  Final   Special Requests   Final    BOTTLES DRAWN AEROBIC AND ANAEROBIC Blood Culture results may not be optimal due to an inadequate volume of blood received in culture bottles   Culture   Final    NO GROWTH < 24 HOURS Performed at [facility], [address], [place], [place] [address]    Report Status PENDING  Incomplete  Culture, blood (routine x 2)     Status: None (Preliminary result)   Collection Time: 02/14/18  3:49 PM  Result Value Ref Range Status   Specimen  Description LEFT ANTECUBITAL  Final   Special Requests   Final    BOTTLES DRAWN AEROBIC AND ANAEROBIC Blood Culture adequate volume   Culture   Final    NO GROWTH < 24 HOURS Performed at [facility], [address] [place], [place] [address]    Report Status PENDING  Incomplete     Labs: Basic Metabolic Panel: Recent Labs  Lab 02/14/18 1548 02/15/18 0455  NA 136 136  K 4.0 4.1  CL 102 104  CO2 23 24  GLUCOSE 206* 166*  BUN 17 12  CREATININE 1.09* 0.91  CALCIUM 8.7* 8.3*   CBC: Recent Labs  Lab 02/14/18 1548 02/15/18 0455  WBC 9.6 8.6  NEUTROABS 7.4 6.1  HGB 13.2 12.4  HCT 41.3 38.7  MCV 92.0 92.4  PLT 193 215   Cardiac Enzymes: Recent Labs  Lab 02/14/18 1548  TROPONINI <0.03   BNP: BNP (last 3 results) Recent Labs    02/14/18 1549  BNP 94.0    CBG: Recent Labs  Lab 02/14/18 2155 02/15/18 1111  GLUCAP 172* 178*    Signed:  Vassie Loll MD.  [facility] 02/15/2018, 1:28 PM

## 2018-02-16 LAB — HIV ANTIBODY (ROUTINE TESTING W REFLEX): HIV Screen 4th Generation wRfx: NONREACTIVE

## 2018-02-17 DIAGNOSIS — Z6825 Body mass index (BMI) 25.0-25.9, adult: Secondary | ICD-10-CM | POA: Diagnosis not present

## 2018-02-17 DIAGNOSIS — Z1389 Encounter for screening for other disorder: Secondary | ICD-10-CM | POA: Diagnosis not present

## 2018-02-17 DIAGNOSIS — E663 Overweight: Secondary | ICD-10-CM | POA: Diagnosis not present

## 2018-02-17 DIAGNOSIS — J189 Pneumonia, unspecified organism: Secondary | ICD-10-CM | POA: Diagnosis not present

## 2018-02-19 LAB — CULTURE, BLOOD (ROUTINE X 2)
Culture: NO GROWTH
Culture: NO GROWTH
Special Requests: ADEQUATE

## 2018-05-13 DIAGNOSIS — E785 Hyperlipidemia, unspecified: Secondary | ICD-10-CM | POA: Diagnosis not present

## 2018-05-13 DIAGNOSIS — I10 Essential (primary) hypertension: Secondary | ICD-10-CM | POA: Diagnosis not present

## 2018-05-13 DIAGNOSIS — Z0001 Encounter for general adult medical examination with abnormal findings: Secondary | ICD-10-CM | POA: Diagnosis not present

## 2018-05-13 DIAGNOSIS — E663 Overweight: Secondary | ICD-10-CM | POA: Diagnosis not present

## 2018-05-13 DIAGNOSIS — Z6826 Body mass index (BMI) 26.0-26.9, adult: Secondary | ICD-10-CM | POA: Diagnosis not present

## 2018-05-13 DIAGNOSIS — E1140 Type 2 diabetes mellitus with diabetic neuropathy, unspecified: Secondary | ICD-10-CM | POA: Diagnosis not present

## 2018-05-13 DIAGNOSIS — Z1389 Encounter for screening for other disorder: Secondary | ICD-10-CM | POA: Diagnosis not present

## 2018-08-17 DIAGNOSIS — Z6825 Body mass index (BMI) 25.0-25.9, adult: Secondary | ICD-10-CM | POA: Diagnosis not present

## 2018-08-17 DIAGNOSIS — R350 Frequency of micturition: Secondary | ICD-10-CM | POA: Diagnosis not present

## 2018-08-17 DIAGNOSIS — B078 Other viral warts: Secondary | ICD-10-CM | POA: Diagnosis not present

## 2018-08-17 DIAGNOSIS — R319 Hematuria, unspecified: Secondary | ICD-10-CM | POA: Diagnosis not present

## 2018-08-17 DIAGNOSIS — E663 Overweight: Secondary | ICD-10-CM | POA: Diagnosis not present

## 2018-08-17 DIAGNOSIS — Z1389 Encounter for screening for other disorder: Secondary | ICD-10-CM | POA: Diagnosis not present

## 2018-11-11 DIAGNOSIS — Z23 Encounter for immunization: Secondary | ICD-10-CM | POA: Diagnosis not present

## 2018-12-14 ENCOUNTER — Other Ambulatory Visit: Payer: Self-pay

## 2018-12-14 NOTE — Patient Outreach (Signed)
[facility]) Care Management  12/14/2018   Sandra Mitchell 12/06/1950 [id number]   Initial Assessment Telephone Screen  Referral Date: 12/11/2018 Referral Source: "eye screening completed on 11/29/2018 at [facility]" Referral Reason: "disease mgmt follow up" Insurance: [facility]   Outreach attempt #1 to patient. Spoke with patient. She denies any acute issues or concerns at present. She lives alone and voices that she is independent with ADLs/IADLs. No recent falls and she reports she does not use assistive device.  Conditions: Per chart review, patient has PMH that includes but not limited to DM, HTN and HLD. Patient voices that she is checking her cbgs about once a week in the home. She states that MD told her since her vales were good she could "lighten up on checking them." She denies any recent hypo/hyperglycemic events. Appetite remains fairly good. She states last cbg was 122. Most recent A1C was 7.9(April 2020). Patient voices she does not know what her A1C goal was and RN CM encouraged pt to discuss this with PCP. She states that she was seeing a Diabetes specialist years ago but no longer sees one. PCP is managing Diabetes.  Appointments: Patient reports she saw PCP last on 11/11/2018.   Medications: Med review completed with pt. She dwneis any issues at present managing and/or affording her meds.  Current Medications:  Current Outpatient Medications  Medication Sig Dispense Refill  . amLODipine (NORVASC) 5 MG tablet Take 5 mg by mouth daily.    Marland Kitchen aspirin EC 81 MG tablet Take 81 mg by mouth daily.    . Cholecalciferol (VITAMIN D-3) 1000 UNITS CAPS Take 1 capsule by mouth daily.    . fluticasone (FLONASE) 50 MCG/ACT nasal spray Place 2 sprays into both nostrils daily.    Marland Kitchen glucose blood test strip 1 each by Other route 2 (two) times daily. Use as instructed 4 x daily. E11.65 Bayer Contour test strips 100 each 5  . loratadine (CLARITIN) 10 MG  tablet Take 1 tablet (10 mg total) by mouth daily as needed for allergies. 30 tablet 1  . losartan (COZAAR) 100 MG tablet Take 100 mg by mouth daily.    . metFORMIN (GLUCOPHAGE) 1000 MG tablet Take 500 mg by mouth 2 (two) times daily with a meal. Pt states taking half tablet in AM and a whole tablet in the PM    . Multiple Vitamins-Minerals (BIOTECT PLUS PO) Take by mouth.    . pravastatin (PRAVACHOL) 20 MG tablet Take 20 mg by mouth daily.    . benzonatate (TESSALON) 200 MG capsule Take 1 capsule (200 mg total) by mouth 3 (three) times daily as needed for cough. (Patient not taking: Reported on 12/14/2018) 30 capsule 0  . Omega 3 1000 MG CAPS Take by mouth.     No current facility-administered medications for this visit.     Functional Status:  In your present state of health, do you have any difficulty performing the following activities: 12/14/2018 02/15/2018  Hearing? N N  Vision? N N  Difficulty concentrating or making decisions? N N  Walking or climbing stairs? N N  Dressing or bathing? N N  Doing errands, shopping? N N  Preparing Food and eating ? N -  Using the Toilet? N -  In the past six months, have you accidently leaked urine? N -  Do you have problems with loss of bowel control? N -  Managing your Medications? N -  Managing your Finances? N -  Housekeeping or  managing your Housekeeping? N -  Some recent data might be hidden    Fall/Depression Screening: Fall Risk  12/14/2018 05/27/2017 11/25/2016  Falls in the past year? 0 No No  Number falls in past yr: 0 - -  Injury with Fall? 0 - -  Risk for fall due to : Medication side effect;Impaired vision - Impaired balance/gait  Follow up Falls evaluation completed;Education provided - -   PHQ 2/9 Scores 12/14/2018 05/27/2017 11/25/2016 11/15/2016  PHQ - 2 Score 0 0 0 0   Consent: [facility] services reviewed and discussed with patient. Verbal consent for services given.  Assessment:  [facility] CM Care Plan Problem One     Most Recent  Value  Care Plan Problem One  Knowledge deficit related to disease mgmt of Diabetes.  Role Documenting the Problem One  Care Management Telephonic Coordinator  Care Plan for Problem One  Active  [facility] Long Term Goal   Patient wil report lowering of A1C level to 7 or less within 90 days.  [facility] Long Term Goal Start Date  12/14/18  Interventions for Problem One Long Term Goal  RN CM disussed diabetes education, freq cbg monitoring. RN CM confirmed pt has working device and knows who to monitor cbg.  [facility] CM Short Term Goal #1   Patient will report montioring cbgs as advised/ordered by MD consistently for the next 30 days.  [facility] CM Short Term Goal #1 Start Date  12/14/18  Interventions for Short Term Goal #1  RN CM reviewed importance of cbg monitoring. RN CM confirmed pt has working meter and knows who to check cbg. RN CM discussed with pt abnormal values and when to alert MD.  [facility] CM Short Term Goal #2   patient will report daily foot monitoring/exams in the home over the next 30 days.  [facility] CM Short Term Goal #2 Start Date  12/14/18  Interventions for Short Term Goal #2  RN CM educated pt on importance of routine foot exams. RN CM discussed proper technique.      Plan:  RN CM discussed with patient next outreach within the month of December. Patient gave verbal consent and in agreement with RN CM follow up timeframe. Patient aware that they may contact RN CM sooner for any issues or concerns. RN CM will send welcome letter to patient. RN CM will send barriers letter and route encounter to PCP.  Enzo Montgomery, RN,BSN,CCM [facility] Management Telephonic Care Management Coordinator Direct Phone: [phone] Toll Free: [phone] Fax: [phone]

## 2019-01-11 ENCOUNTER — Other Ambulatory Visit: Payer: Self-pay

## 2019-01-11 NOTE — Patient Outreach (Signed)
[facility]) Care Management  01/11/2019  [place] 1950/06/14 [id number]   Telephone Assessment    Outreach attempt # 1to patient. No answer. RN CM left HIPAA compliant voicemail message along with contact info.     Plan: RN CM will make outreach attempt to patient within the month of January if no return call from patient.   Enzo Montgomery, RN,BSN,CCM [facility] Management Telephonic Care Management Coordinator Direct Phone: [phone] Toll Free: [phone] Fax: [phone]

## 2019-01-12 ENCOUNTER — Ambulatory Visit: Payer: Medicare HMO

## 2019-02-10 ENCOUNTER — Other Ambulatory Visit: Payer: Self-pay

## 2019-02-10 NOTE — Patient Outreach (Signed)
[facility]) Care Management  02/10/2019  Sandra Mitchell [facility] Nov 18, 1950 [id number]   Telephone Assessment   Outreach attempt to patient. No answer after several rings. RN CM will send unsuccessful outreach letter to patient.    Plan: RN CM will make outreach attempt to patient within the month of March.    Antionette Fairy, RN,BSN,CCM [facility] Care Management Telephonic Care Management Coordinator Direct Phone: [phone] Toll Free: [phone] Fax: [phone]

## 2019-04-08 ENCOUNTER — Other Ambulatory Visit: Payer: Self-pay

## 2019-04-08 NOTE — Patient Outreach (Addendum)
[facility]) Care Management  04/08/2019  Joanna Borawski [facility] Feb 28, 1950 [id number]   Telephone Assessment    Outreach attempt #3 to patient. No answer at present.     Plan: RN CM will make outreach attempt to patient within the month of May.  Antionette Fairy, RN,BSN,CCM [facility] Care Management Telephonic Care Management Coordinator Direct Phone: [phone] Toll Free: [phone] Fax: [phone]

## 2019-05-25 DIAGNOSIS — J302 Other seasonal allergic rhinitis: Secondary | ICD-10-CM | POA: Diagnosis not present

## 2019-05-25 DIAGNOSIS — I10 Essential (primary) hypertension: Secondary | ICD-10-CM | POA: Diagnosis not present

## 2019-05-25 DIAGNOSIS — Z1389 Encounter for screening for other disorder: Secondary | ICD-10-CM | POA: Diagnosis not present

## 2019-05-25 DIAGNOSIS — Z6826 Body mass index (BMI) 26.0-26.9, adult: Secondary | ICD-10-CM | POA: Diagnosis not present

## 2019-05-25 DIAGNOSIS — E782 Mixed hyperlipidemia: Secondary | ICD-10-CM | POA: Diagnosis not present

## 2019-05-25 DIAGNOSIS — E119 Type 2 diabetes mellitus without complications: Secondary | ICD-10-CM | POA: Diagnosis not present

## 2019-05-25 DIAGNOSIS — Z0000 Encounter for general adult medical examination without abnormal findings: Secondary | ICD-10-CM | POA: Diagnosis not present

## 2019-05-25 DIAGNOSIS — E1140 Type 2 diabetes mellitus with diabetic neuropathy, unspecified: Secondary | ICD-10-CM | POA: Diagnosis not present

## 2019-06-04 ENCOUNTER — Other Ambulatory Visit: Payer: Self-pay

## 2019-06-04 NOTE — Patient Outreach (Signed)
[facility]) Care Management  06/04/2019  Daija Routson [facility] [birth year] [id number]   Telephone Assessment    Outreach attempt to patient. No answer after several rings.      Plan: RN CM will make outreach attempt to patient within the month of August.   Tuwanna Krausz Magda Paganini [facility] Care Management Telephonic Care Management Coordinator Direct Phone: [phone] Toll Free: [phone] Fax: [phone]

## 2019-06-08 ENCOUNTER — Ambulatory Visit: Payer: Self-pay

## 2019-09-01 ENCOUNTER — Other Ambulatory Visit: Payer: Self-pay

## 2019-09-01 NOTE — Patient Outreach (Signed)
[facility]) Care Management  09/01/2019  Sandra Mitchell [facility] 07-09-1950 [id number]   Telephone Assessment Quarterly Call    Unsuccessful outreach attempt to patient.       Plan: RN CM will make quarterly outreach attempt within the month of Nov.    Sandra Mitchell Sandra Mitchell [facility] Sandra [facility] Care Management Telephonic Care Management Coordinator Direct Phone: [phone] Toll Free: [phone] Fax: [phone]

## 2019-09-03 ENCOUNTER — Ambulatory Visit: Payer: Self-pay

## 2019-10-20 DIAGNOSIS — Z23 Encounter for immunization: Secondary | ICD-10-CM | POA: Diagnosis not present

## 2019-10-20 DIAGNOSIS — Z6825 Body mass index (BMI) 25.0-25.9, adult: Secondary | ICD-10-CM | POA: Diagnosis not present

## 2019-10-20 DIAGNOSIS — I10 Essential (primary) hypertension: Secondary | ICD-10-CM | POA: Diagnosis not present

## 2019-10-20 DIAGNOSIS — E785 Hyperlipidemia, unspecified: Secondary | ICD-10-CM | POA: Diagnosis not present

## 2019-10-20 DIAGNOSIS — E1140 Type 2 diabetes mellitus with diabetic neuropathy, unspecified: Secondary | ICD-10-CM | POA: Diagnosis not present

## 2019-11-09 DIAGNOSIS — Z0100 Encounter for examination of eyes and vision without abnormal findings: Secondary | ICD-10-CM | POA: Diagnosis not present

## 2019-11-09 DIAGNOSIS — E119 Type 2 diabetes mellitus without complications: Secondary | ICD-10-CM | POA: Diagnosis not present

## 2019-11-09 DIAGNOSIS — H5213 Myopia, bilateral: Secondary | ICD-10-CM | POA: Diagnosis not present

## 2019-11-15 ENCOUNTER — Other Ambulatory Visit (HOSPITAL_COMMUNITY): Payer: Self-pay | Admitting: Family Medicine

## 2019-11-15 DIAGNOSIS — Z1231 Encounter for screening mammogram for malignant neoplasm of breast: Secondary | ICD-10-CM

## 2019-11-22 ENCOUNTER — Other Ambulatory Visit: Payer: Self-pay

## 2019-11-22 NOTE — Patient Outreach (Signed)
[facility]) Care Management  11/22/2019  Sandra Mitchell [facility] Sep 22, 1950 [id number]   Telephone Assessment    Multiple attempts to establish contact with patient without success. No response from letter mailed to patient. Case is being closed at this time.      Plan: RN CM will close case at this time.     Antionette Fairy, RN,BSN,CCM [facility] Care Management Telephonic Care Management Coordinator Direct Phone: [phone] Toll Free: [phone] Fax: [phone]'

## 2019-11-25 ENCOUNTER — Ambulatory Visit: Payer: Self-pay

## 2019-11-29 ENCOUNTER — Ambulatory Visit (HOSPITAL_COMMUNITY)
Admission: RE | Admit: 2019-11-29 | Discharge: 2019-11-29 | Disposition: A | Discharge status: Home / Self Care | Payer: Medicare HMO | Source: Ambulatory Visit | Attending: Family Medicine | Admitting: Family Medicine

## 2019-11-29 ENCOUNTER — Other Ambulatory Visit: Payer: Self-pay

## 2019-11-29 DIAGNOSIS — Z1231 Encounter for screening mammogram for malignant neoplasm of breast: Secondary | ICD-10-CM | POA: Diagnosis not present

## 2020-03-03 ENCOUNTER — Other Ambulatory Visit: Payer: Self-pay

## 2020-06-21 DIAGNOSIS — I10 Essential (primary) hypertension: Secondary | ICD-10-CM | POA: Diagnosis not present

## 2020-06-21 DIAGNOSIS — Z0001 Encounter for general adult medical examination with abnormal findings: Secondary | ICD-10-CM | POA: Diagnosis not present

## 2020-06-21 DIAGNOSIS — Z1389 Encounter for screening for other disorder: Secondary | ICD-10-CM | POA: Diagnosis not present

## 2020-06-21 DIAGNOSIS — E1140 Type 2 diabetes mellitus with diabetic neuropathy, unspecified: Secondary | ICD-10-CM | POA: Diagnosis not present

## 2020-11-02 DIAGNOSIS — Z23 Encounter for immunization: Secondary | ICD-10-CM | POA: Diagnosis not present

## 2021-03-22 DIAGNOSIS — J029 Acute pharyngitis, unspecified: Secondary | ICD-10-CM | POA: Diagnosis not present

## 2021-11-14 DIAGNOSIS — Z6828 Body mass index (BMI) 28.0-28.9, adult: Secondary | ICD-10-CM | POA: Diagnosis not present

## 2021-11-14 DIAGNOSIS — E663 Overweight: Secondary | ICD-10-CM | POA: Diagnosis not present

## 2021-11-14 DIAGNOSIS — E785 Hyperlipidemia, unspecified: Secondary | ICD-10-CM | POA: Diagnosis not present

## 2021-11-14 DIAGNOSIS — Z1331 Encounter for screening for depression: Secondary | ICD-10-CM | POA: Diagnosis not present

## 2021-11-14 DIAGNOSIS — E1140 Type 2 diabetes mellitus with diabetic neuropathy, unspecified: Secondary | ICD-10-CM | POA: Diagnosis not present

## 2021-11-14 DIAGNOSIS — Z0001 Encounter for general adult medical examination with abnormal findings: Secondary | ICD-10-CM | POA: Diagnosis not present

## 2021-11-14 DIAGNOSIS — I10 Essential (primary) hypertension: Secondary | ICD-10-CM | POA: Diagnosis not present

## 2021-11-14 DIAGNOSIS — Z23 Encounter for immunization: Secondary | ICD-10-CM | POA: Diagnosis not present

## 2021-11-14 DIAGNOSIS — E119 Type 2 diabetes mellitus without complications: Secondary | ICD-10-CM | POA: Diagnosis not present

## 2021-11-15 DIAGNOSIS — E119 Type 2 diabetes mellitus without complications: Secondary | ICD-10-CM | POA: Diagnosis not present

## 2021-11-15 DIAGNOSIS — H524 Presbyopia: Secondary | ICD-10-CM | POA: Diagnosis not present

## 2021-11-29 DIAGNOSIS — Z0100 Encounter for examination of eyes and vision without abnormal findings: Secondary | ICD-10-CM | POA: Diagnosis not present

## 2022-05-17 ENCOUNTER — Other Ambulatory Visit (HOSPITAL_COMMUNITY): Payer: Self-pay | Admitting: Family Medicine

## 2022-05-17 DIAGNOSIS — Z1231 Encounter for screening mammogram for malignant neoplasm of breast: Secondary | ICD-10-CM

## 2022-05-30 ENCOUNTER — Ambulatory Visit (HOSPITAL_COMMUNITY)
Admission: RE | Admit: 2022-05-30 | Discharge: 2022-05-30 | Disposition: A | Discharge status: Home / Self Care | Payer: Medicare HMO | Source: Ambulatory Visit | Attending: Family Medicine | Admitting: Family Medicine

## 2022-05-30 ENCOUNTER — Encounter (HOSPITAL_COMMUNITY): Payer: Self-pay

## 2022-05-30 DIAGNOSIS — Z1231 Encounter for screening mammogram for malignant neoplasm of breast: Secondary | ICD-10-CM | POA: Diagnosis not present

## 2022-08-22 DIAGNOSIS — Z0001 Encounter for general adult medical examination with abnormal findings: Secondary | ICD-10-CM | POA: Diagnosis not present

## 2022-08-22 DIAGNOSIS — Z6825 Body mass index (BMI) 25.0-25.9, adult: Secondary | ICD-10-CM | POA: Diagnosis not present

## 2022-08-22 DIAGNOSIS — I10 Essential (primary) hypertension: Secondary | ICD-10-CM | POA: Diagnosis not present

## 2022-08-22 DIAGNOSIS — E1140 Type 2 diabetes mellitus with diabetic neuropathy, unspecified: Secondary | ICD-10-CM | POA: Diagnosis not present

## 2022-08-22 DIAGNOSIS — E785 Hyperlipidemia, unspecified: Secondary | ICD-10-CM | POA: Diagnosis not present

## 2022-08-22 DIAGNOSIS — Z1331 Encounter for screening for depression: Secondary | ICD-10-CM | POA: Diagnosis not present

## 2022-08-22 DIAGNOSIS — J302 Other seasonal allergic rhinitis: Secondary | ICD-10-CM | POA: Diagnosis not present

## 2022-09-21 IMAGING — MG DIGITAL SCREENING BILAT W/ TOMO W/ CAD
8 series · 8 of 24 positions shown · non-contrast
Comparison: Previous exam(s).

CLINICAL DATA: Screening.

EXAM:
DIGITAL SCREENING BILATERAL MAMMOGRAM WITH TOMO AND CAD

[R CC synth-2D]
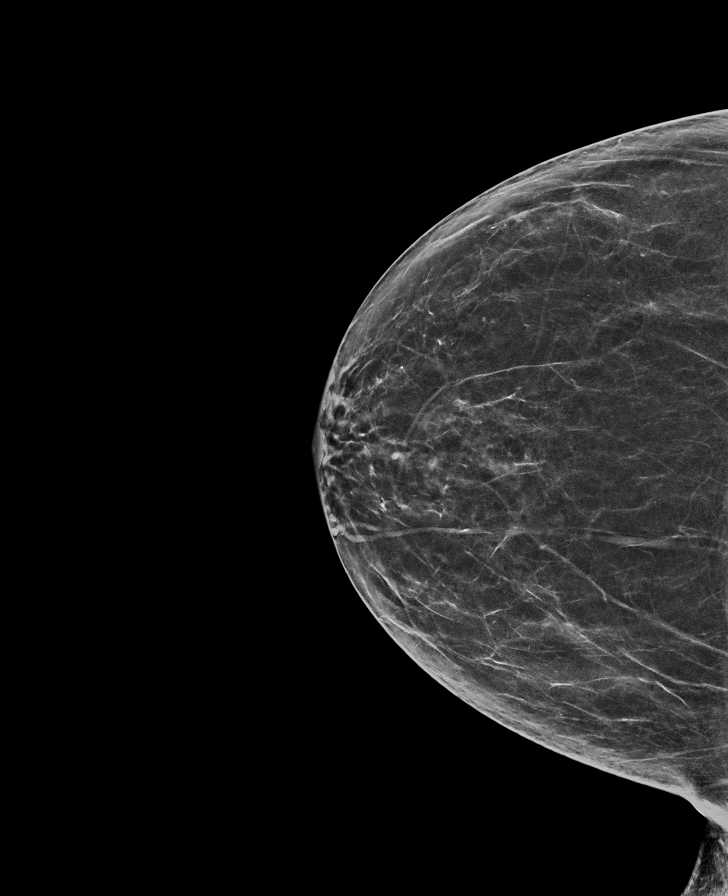

[L MLO synth-2D]
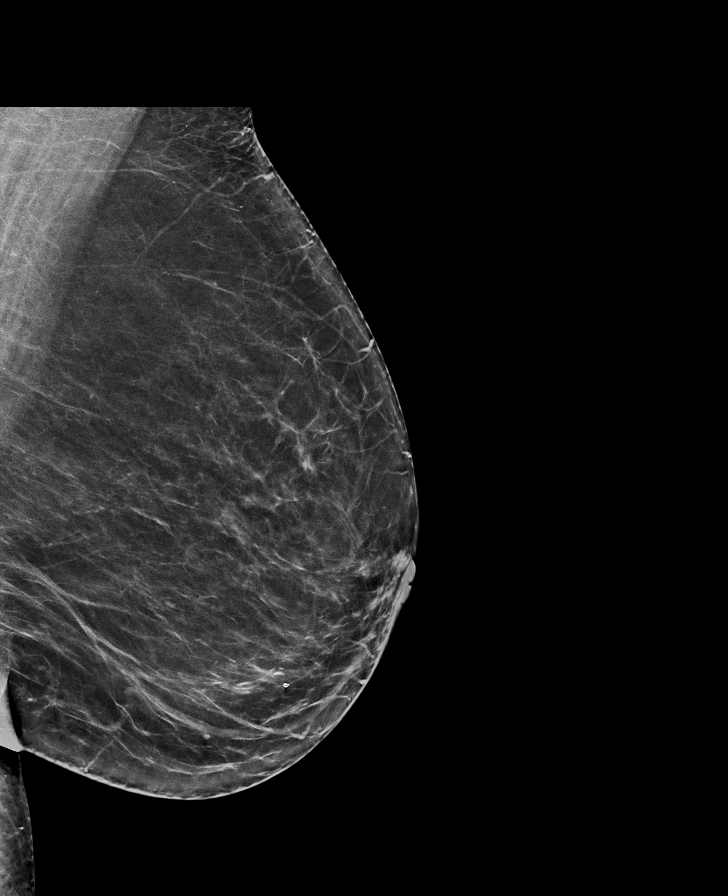

[R MLO synth-2D]
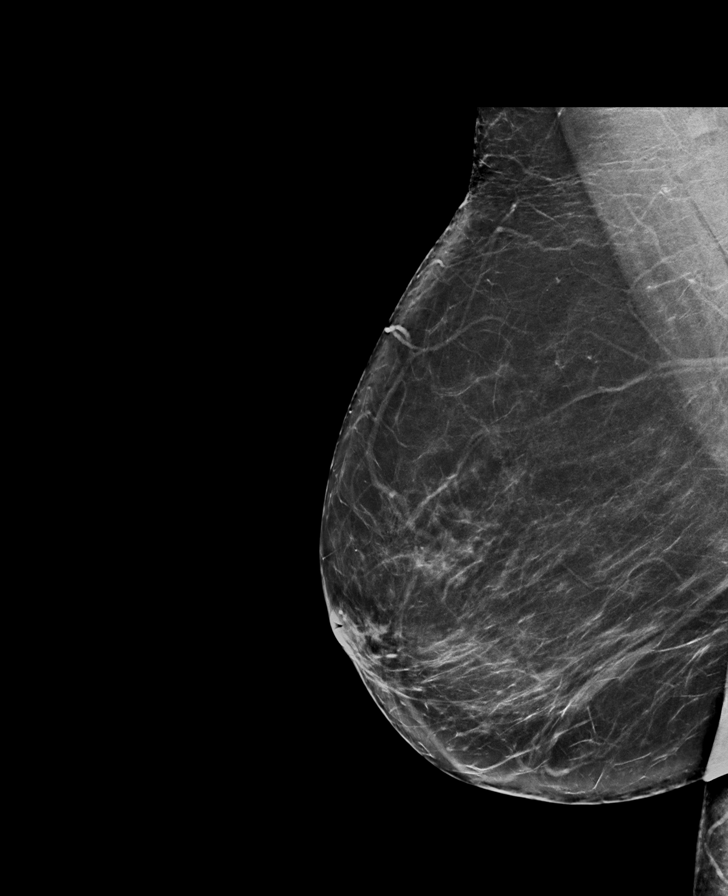

[L CC synth-2D]
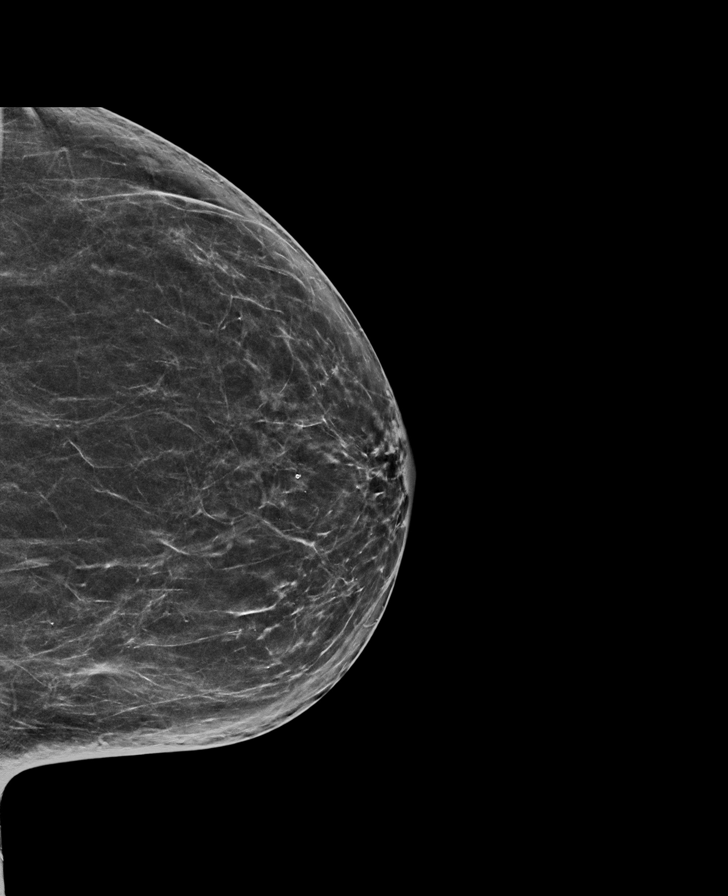

[R MLO tomo · tomo slice 41/80.0]
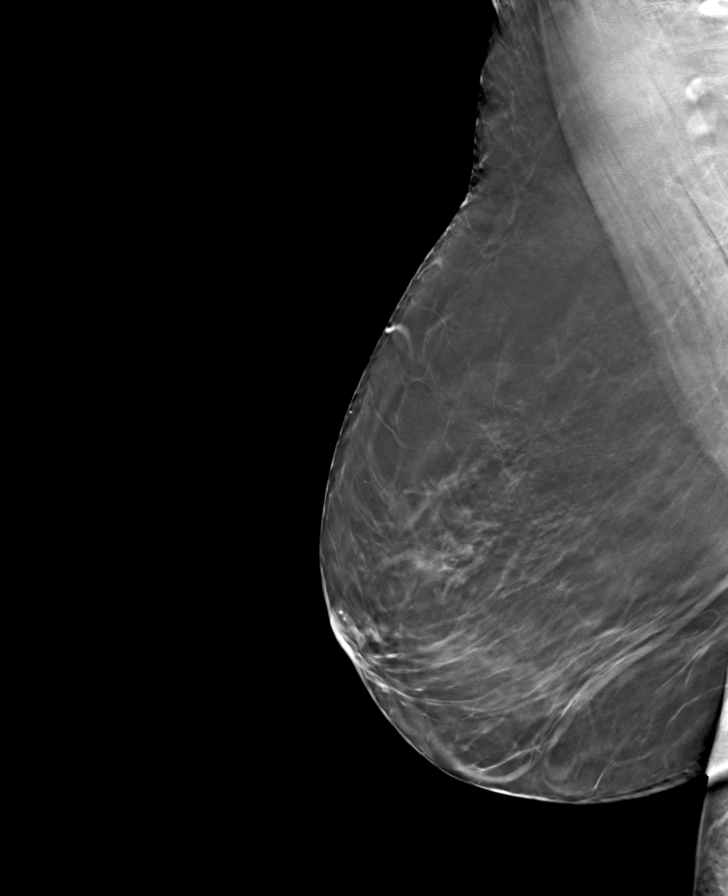

[R CC tomo · tomo slice 34/67.0]
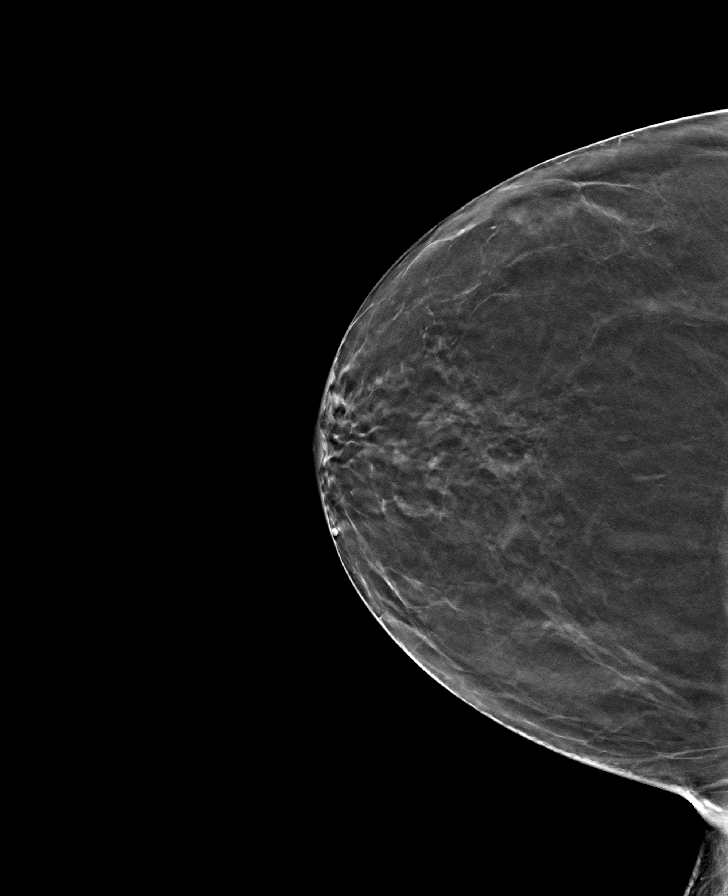

[L CC tomo · tomo slice 35/69.0]
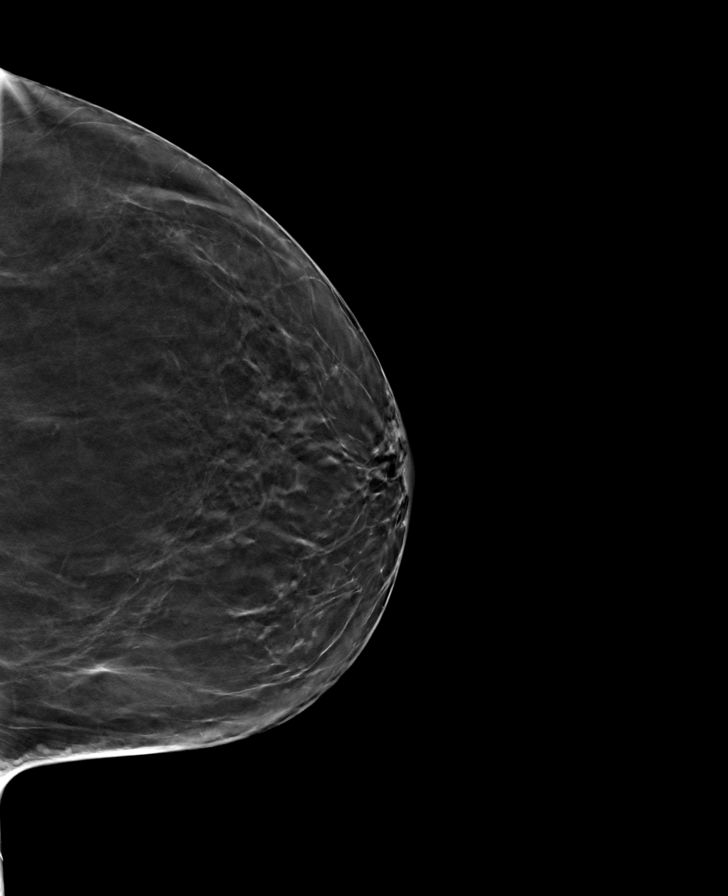

[L MLO tomo · tomo slice 37/74.0]
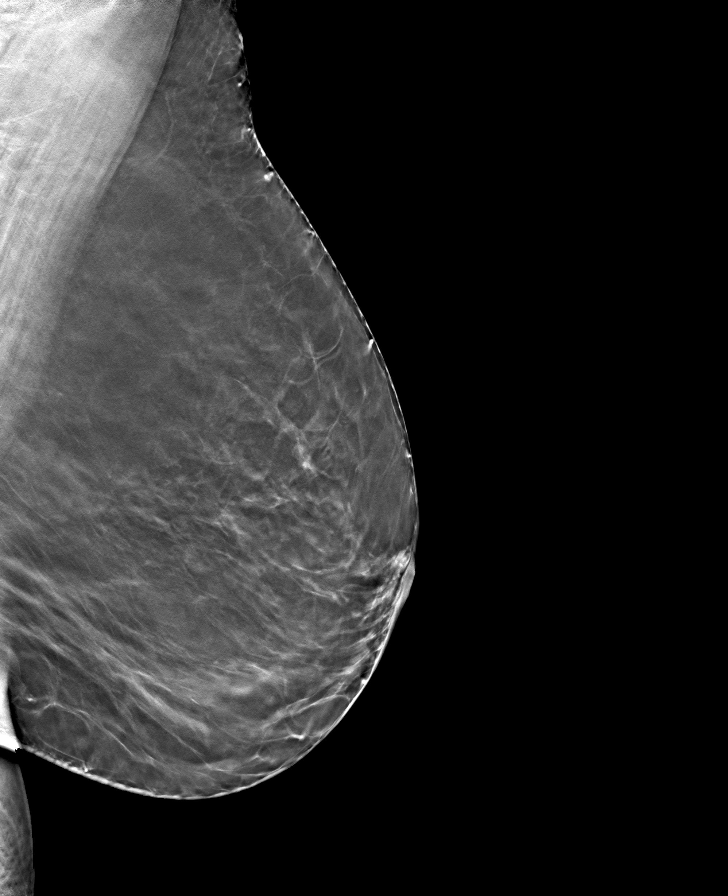

[8 of 24 positions shown; findings below may reference images not displayed]

ACR Breast Density Category b: There are scattered areas of
fibroglandular density.
FINDINGS: There are no findings suspicious for malignancy. Images were
processed with CAD.
IMPRESSION: No mammographic evidence of malignancy. A result letter of this
screening mammogram will be mailed directly to the patient.

RECOMMENDATION:
Screening mammogram in one year. (Code:[id number])

BI-RADS CATEGORY  1: Negative.

## 2022-11-26 DIAGNOSIS — Z23 Encounter for immunization: Secondary | ICD-10-CM | POA: Diagnosis not present

## 2022-12-11 DIAGNOSIS — J069 Acute upper respiratory infection, unspecified: Secondary | ICD-10-CM | POA: Diagnosis not present

## 2022-12-11 DIAGNOSIS — Z6825 Body mass index (BMI) 25.0-25.9, adult: Secondary | ICD-10-CM | POA: Diagnosis not present

## 2022-12-11 DIAGNOSIS — H6123 Impacted cerumen, bilateral: Secondary | ICD-10-CM | POA: Diagnosis not present

## 2022-12-11 DIAGNOSIS — E1140 Type 2 diabetes mellitus with diabetic neuropathy, unspecified: Secondary | ICD-10-CM | POA: Diagnosis not present

## 2022-12-11 DIAGNOSIS — E785 Hyperlipidemia, unspecified: Secondary | ICD-10-CM | POA: Diagnosis not present

## 2022-12-11 DIAGNOSIS — I10 Essential (primary) hypertension: Secondary | ICD-10-CM | POA: Diagnosis not present

## 2022-12-21 DIAGNOSIS — E1140 Type 2 diabetes mellitus with diabetic neuropathy, unspecified: Secondary | ICD-10-CM | POA: Diagnosis not present

## 2022-12-21 DIAGNOSIS — I10 Essential (primary) hypertension: Secondary | ICD-10-CM | POA: Diagnosis not present

## 2022-12-21 DIAGNOSIS — E785 Hyperlipidemia, unspecified: Secondary | ICD-10-CM | POA: Diagnosis not present

## 2023-01-21 DIAGNOSIS — E1140 Type 2 diabetes mellitus with diabetic neuropathy, unspecified: Secondary | ICD-10-CM | POA: Diagnosis not present

## 2023-01-21 DIAGNOSIS — E785 Hyperlipidemia, unspecified: Secondary | ICD-10-CM | POA: Diagnosis not present

## 2023-01-21 DIAGNOSIS — I10 Essential (primary) hypertension: Secondary | ICD-10-CM | POA: Diagnosis not present
